# Patient Record
Sex: Male | Born: 1970 | Race: White | Hispanic: No | Marital: Single | Smoking: Former smoker
Health system: Southern US, Community
[De-identification: ages and names within clinical notes are randomized; demographics above are authoritative.]

## PROBLEM LIST (undated history)

## (undated) DIAGNOSIS — I471 Supraventricular tachycardia, unspecified: Secondary | ICD-10-CM

## (undated) DIAGNOSIS — Z5189 Encounter for other specified aftercare: Secondary | ICD-10-CM

## (undated) DIAGNOSIS — E785 Hyperlipidemia, unspecified: Secondary | ICD-10-CM

## (undated) DIAGNOSIS — F419 Anxiety disorder, unspecified: Secondary | ICD-10-CM

## (undated) DIAGNOSIS — IMO0001 Reserved for inherently not codable concepts without codable children: Secondary | ICD-10-CM

## (undated) DIAGNOSIS — D649 Anemia, unspecified: Secondary | ICD-10-CM

## (undated) DIAGNOSIS — Z860100 Personal history of colon polyps, unspecified: Secondary | ICD-10-CM

## (undated) DIAGNOSIS — K219 Gastro-esophageal reflux disease without esophagitis: Secondary | ICD-10-CM

## (undated) DIAGNOSIS — K279 Peptic ulcer, site unspecified, unspecified as acute or chronic, without hemorrhage or perforation: Secondary | ICD-10-CM

## (undated) HISTORY — DX: Anxiety disorder, unspecified: F41.9

## (undated) HISTORY — DX: Supraventricular tachycardia, unspecified: I47.10

## (undated) HISTORY — DX: Supraventricular tachycardia: I47.1

## (undated) HISTORY — DX: Hyperlipidemia, unspecified: E78.5

## (undated) HISTORY — DX: Gastro-esophageal reflux disease without esophagitis: K21.9

## (undated) HISTORY — PX: APPENDECTOMY: SHX54

## (undated) HISTORY — DX: Anemia, unspecified: D64.9

## (undated) HISTORY — DX: Personal history of colonic polyps: Z86.010

## (undated) HISTORY — DX: Peptic ulcer, site unspecified, unspecified as acute or chronic, without hemorrhage or perforation: K27.9

## (undated) HISTORY — DX: Encounter for other specified aftercare: Z51.89

## (undated) HISTORY — DX: Personal history of colon polyps, unspecified: Z86.0100

## (undated) HISTORY — DX: Reserved for inherently not codable concepts without codable children: IMO0001

## (undated) HISTORY — PX: OTHER SURGICAL HISTORY: SHX169

---

## 2000-01-19 HISTORY — PX: ROTATOR CUFF REPAIR: SHX139

## 2001-02-27 ENCOUNTER — Emergency Department (HOSPITAL_COMMUNITY): Admission: EM | Admit: 2001-02-27 | Discharge: 2001-02-27 | Payer: Self-pay | Admitting: Emergency Medicine

## 2001-02-27 ENCOUNTER — Encounter: Payer: Self-pay | Admitting: Emergency Medicine

## 2001-12-04 ENCOUNTER — Emergency Department (HOSPITAL_COMMUNITY): Admission: EM | Admit: 2001-12-04 | Discharge: 2001-12-04 | Payer: Self-pay | Admitting: Emergency Medicine

## 2001-12-04 ENCOUNTER — Encounter: Payer: Self-pay | Admitting: Emergency Medicine

## 2002-03-21 ENCOUNTER — Emergency Department (HOSPITAL_COMMUNITY): Admission: EM | Admit: 2002-03-21 | Discharge: 2002-03-21 | Payer: Self-pay | Admitting: Emergency Medicine

## 2002-03-21 ENCOUNTER — Encounter: Payer: Self-pay | Admitting: Emergency Medicine

## 2002-04-16 ENCOUNTER — Emergency Department (HOSPITAL_COMMUNITY): Admission: EM | Admit: 2002-04-16 | Discharge: 2002-04-16 | Payer: Self-pay | Admitting: *Deleted

## 2003-07-15 ENCOUNTER — Emergency Department (HOSPITAL_COMMUNITY): Admission: EM | Admit: 2003-07-15 | Discharge: 2003-07-15 | Payer: Self-pay | Admitting: Emergency Medicine

## 2007-04-26 ENCOUNTER — Emergency Department (HOSPITAL_COMMUNITY): Admission: EM | Admit: 2007-04-26 | Discharge: 2007-04-26 | Payer: Self-pay | Admitting: Emergency Medicine

## 2008-10-14 ENCOUNTER — Emergency Department (HOSPITAL_COMMUNITY): Admission: EM | Admit: 2008-10-14 | Discharge: 2008-10-14 | Payer: Self-pay | Admitting: Emergency Medicine

## 2009-12-23 ENCOUNTER — Emergency Department (HOSPITAL_COMMUNITY)
Admission: EM | Admit: 2009-12-23 | Discharge: 2009-12-23 | Payer: Self-pay | Source: Home / Self Care | Admitting: Emergency Medicine

## 2010-01-18 HISTORY — PX: HERNIA REPAIR: SHX51

## 2010-03-06 ENCOUNTER — Emergency Department (HOSPITAL_COMMUNITY): Payer: Medicaid Other

## 2010-03-06 ENCOUNTER — Inpatient Hospital Stay (HOSPITAL_COMMUNITY): Payer: Medicaid Other

## 2010-03-06 ENCOUNTER — Inpatient Hospital Stay (HOSPITAL_COMMUNITY)
Admission: EM | Admit: 2010-03-06 | Discharge: 2010-03-08 | DRG: 811 | Disposition: A | Discharge status: Home / Self Care | Payer: Medicaid Other | Attending: Nephrology | Admitting: Nephrology

## 2010-03-06 DIAGNOSIS — F411 Generalized anxiety disorder: Secondary | ICD-10-CM | POA: Diagnosis present

## 2010-03-06 DIAGNOSIS — K254 Chronic or unspecified gastric ulcer with hemorrhage: Secondary | ICD-10-CM | POA: Diagnosis present

## 2010-03-06 DIAGNOSIS — M129 Arthropathy, unspecified: Secondary | ICD-10-CM | POA: Diagnosis present

## 2010-03-06 DIAGNOSIS — K620 Anal polyp: Secondary | ICD-10-CM | POA: Diagnosis present

## 2010-03-06 DIAGNOSIS — K5730 Diverticulosis of large intestine without perforation or abscess without bleeding: Secondary | ICD-10-CM | POA: Diagnosis present

## 2010-03-06 DIAGNOSIS — I498 Other specified cardiac arrhythmias: Secondary | ICD-10-CM | POA: Diagnosis present

## 2010-03-06 DIAGNOSIS — D500 Iron deficiency anemia secondary to blood loss (chronic): Principal | ICD-10-CM | POA: Diagnosis present

## 2010-03-06 DIAGNOSIS — K621 Rectal polyp: Secondary | ICD-10-CM | POA: Diagnosis present

## 2010-03-06 DIAGNOSIS — D126 Benign neoplasm of colon, unspecified: Secondary | ICD-10-CM | POA: Diagnosis present

## 2010-03-06 DIAGNOSIS — K449 Diaphragmatic hernia without obstruction or gangrene: Secondary | ICD-10-CM | POA: Diagnosis present

## 2010-03-06 DIAGNOSIS — K219 Gastro-esophageal reflux disease without esophagitis: Secondary | ICD-10-CM | POA: Diagnosis present

## 2010-03-06 DIAGNOSIS — K644 Residual hemorrhoidal skin tags: Secondary | ICD-10-CM | POA: Diagnosis present

## 2010-03-06 DIAGNOSIS — I10 Essential (primary) hypertension: Secondary | ICD-10-CM | POA: Diagnosis present

## 2010-03-06 LAB — RAPID URINE DRUG SCREEN, HOSP PERFORMED
Cocaine: NOT DETECTED
Opiates: NOT DETECTED

## 2010-03-06 LAB — DIFFERENTIAL
Basophils Absolute: 0.1 10*3/uL (ref 0.0–0.1)
Eosinophils Absolute: 0.2 10*3/uL (ref 0.0–0.7)
Lymphs Abs: 2.1 10*3/uL (ref 0.7–4.0)
Monocytes Absolute: 0.5 10*3/uL (ref 0.1–1.0)
Monocytes Relative: 8 % (ref 3–12)
Neutro Abs: 3 10*3/uL (ref 1.7–7.7)

## 2010-03-06 LAB — CBC
HCT: 30.8 % — ABNORMAL LOW (ref 39.0–52.0)
MCHC: 27.6 g/dL — ABNORMAL LOW (ref 30.0–36.0)
Platelets: 180 10*3/uL (ref 150–400)
RDW: 17.4 % — ABNORMAL HIGH (ref 11.5–15.5)

## 2010-03-06 LAB — BRAIN NATRIURETIC PEPTIDE: Pro B Natriuretic peptide (BNP): <30 pg/mL (ref 0.0–100.0)

## 2010-03-06 LAB — TROPONIN I: Troponin I: 0.01 ng/mL (ref 0.00–0.06)

## 2010-03-06 LAB — HEPATIC FUNCTION PANEL
ALT: 18 U/L (ref 0–53)
Alkaline Phosphatase: 79 U/L (ref 39–117)
Bilirubin, Direct: <0.1 mg/dL (ref 0.0–0.3)

## 2010-03-06 LAB — BASIC METABOLIC PANEL
BUN: 9 mg/dL (ref 6–23)
GFR calc non Af Amer: >60 mL/min (ref >60)
Glucose, Bld: 71 mg/dL (ref 70–99)
Potassium: 3.6 mEq/L (ref 3.5–5.1)

## 2010-03-06 LAB — PROTIME-INR: INR: 0.98 (ref 0.00–1.49)

## 2010-03-06 MED ORDER — IOHEXOL 300 MG/ML  SOLN
100.0000 mL | Freq: Once | INTRAMUSCULAR | Status: AC | PRN
  Administered 2010-03-06: 100 mL via INTRAVENOUS

## 2010-03-07 ENCOUNTER — Other Ambulatory Visit (INDEPENDENT_AMBULATORY_CARE_PROVIDER_SITE_OTHER): Payer: Self-pay | Admitting: Internal Medicine

## 2010-03-07 DIAGNOSIS — K219 Gastro-esophageal reflux disease without esophagitis: Secondary | ICD-10-CM

## 2010-03-07 DIAGNOSIS — D126 Benign neoplasm of colon, unspecified: Secondary | ICD-10-CM

## 2010-03-07 DIAGNOSIS — K253 Acute gastric ulcer without hemorrhage or perforation: Secondary | ICD-10-CM

## 2010-03-07 DIAGNOSIS — D509 Iron deficiency anemia, unspecified: Secondary | ICD-10-CM

## 2010-03-07 DIAGNOSIS — K209 Esophagitis, unspecified: Secondary | ICD-10-CM

## 2010-03-07 DIAGNOSIS — K921 Melena: Secondary | ICD-10-CM

## 2010-03-07 LAB — FERRITIN: Ferritin: 2 ng/mL — ABNORMAL LOW (ref 22–322)

## 2010-03-07 LAB — LIPID PANEL
HDL: 22 mg/dL — ABNORMAL LOW (ref >39)
LDL Cholesterol: 54 mg/dL (ref 0–99)
Total CHOL/HDL Ratio: 5.5 RATIO
Triglycerides: 227 mg/dL — ABNORMAL HIGH (ref <150)
VLDL: 45 mg/dL — ABNORMAL HIGH (ref 0–40)

## 2010-03-07 LAB — COMPREHENSIVE METABOLIC PANEL
ALT: 17 U/L (ref 0–53)
AST: 15 U/L (ref 0–37)
Calcium: 9.1 mg/dL (ref 8.4–10.5)
GFR calc Af Amer: >60 mL/min (ref >60)
Sodium: 141 mEq/L (ref 135–145)
Total Protein: 6.7 g/dL (ref 6.0–8.3)

## 2010-03-07 LAB — CBC
Hemoglobin: 7.9 g/dL — ABNORMAL LOW (ref 13.0–17.0)
MCH: 17.8 pg — ABNORMAL LOW (ref 26.0–34.0)
RBC: 4.43 MIL/uL (ref 4.22–5.81)
WBC: 5.7 10*3/uL (ref 4.0–10.5)

## 2010-03-07 LAB — DIFFERENTIAL
Basophils Absolute: 0.1 10*3/uL (ref 0.0–0.1)
Basophils Relative: 2 % — ABNORMAL HIGH (ref 0–1)
Lymphocytes Relative: 38 % (ref 12–46)
Monocytes Relative: 9 % (ref 3–12)
Neutro Abs: 2.7 10*3/uL (ref 1.7–7.7)

## 2010-03-07 LAB — CARDIAC PANEL(CRET KIN+CKTOT+MB+TROPI)
Relative Index: INVALID (ref 0.0–2.5)
Total CK: 55 U/L (ref 7–232)

## 2010-03-07 LAB — MAGNESIUM: Magnesium: 2.1 mg/dL (ref 1.5–2.5)

## 2010-03-07 LAB — IRON AND TIBC: Saturation Ratios: 4 % — ABNORMAL LOW (ref 20–55)

## 2010-03-08 LAB — CROSSMATCH
ABO/RH(D): A POS
Antibody Screen: NEGATIVE
Unit division: 0

## 2010-03-08 LAB — TSH: TSH: 3.891 u[IU]/mL (ref 0.350–4.500)

## 2010-03-08 LAB — DIFFERENTIAL
Basophils Absolute: 0.1 10*3/uL (ref 0.0–0.1)
Basophils Relative: 1 % (ref 0–1)
Eosinophils Absolute: 0.2 10*3/uL (ref 0.0–0.7)
Eosinophils Relative: 4 % (ref 0–5)
Lymphocytes Relative: 30 % (ref 12–46)
Lymphs Abs: 1.7 10*3/uL (ref 0.7–4.0)
Monocytes Absolute: 0.4 10*3/uL (ref 0.1–1.0)
Monocytes Relative: 7 % (ref 3–12)
Neutro Abs: 3.2 10*3/uL (ref 1.7–7.7)
Neutrophils Relative %: 58 % (ref 43–77)

## 2010-03-08 LAB — CBC
HCT: 29.4 % — ABNORMAL LOW (ref 39.0–52.0)
Hemoglobin: 8.2 g/dL — ABNORMAL LOW (ref 13.0–17.0)
MCH: 18.6 pg — ABNORMAL LOW (ref 26.0–34.0)
MCHC: 27.9 g/dL — ABNORMAL LOW (ref 30.0–36.0)
MCV: 66.7 fL — ABNORMAL LOW (ref 78.0–100.0)
Platelets: 144 10*3/uL — ABNORMAL LOW (ref 150–400)
RBC: 4.41 MIL/uL (ref 4.22–5.81)
RDW: 18 % — ABNORMAL HIGH (ref 11.5–15.5)
WBC: 5.6 10*3/uL (ref 4.0–10.5)

## 2010-03-09 LAB — H. PYLORI ANTIBODY, IGG: H Pylori IgG: 2.76 {ISR} — ABNORMAL HIGH

## 2010-03-14 NOTE — Discharge Summary (Signed)
  NAMEBIFF, [name]               ACCOUNT NO.:  [id number]  MEDICAL RECORD NO.:  [id number]           PATIENT TYPE:  LOCATION:                                 FACILITY:  PHYSICIAN:  Celso Amy, MD   DATE OF BIRTH:  03/14/1970  DATE OF ADMISSION: DATE OF DISCHARGE:  LH                              DISCHARGE SUMMARY   ADMITTING DIAGNOSES: 1. Supraventricular tachycardia. 2. Microcytic anemia. 3. Rectal bleeding. 4. Gastroesophageal reflux disease. 5. Arthritis. 6. Anxiety disorder.  DISCHARGE DIAGNOSES: 1. Resolved supraventricular tachycardia. 2. Rectal bleeding, now resolved. 3. Microcytic anemia because of chronic blood loss. 4. Gastroesophageal reflux disease. 5. Arthritis. 6. Anxiety disorder.  CONSULTS OBTAINED:  GI consult was obtained.  PROCEDURE PERFORMED:  EGD and colonoscopy was done.  MEDICATIONS AT TIME OF ADMISSION: 1. Omeprazole 20 mg p.o. daily. 2. Vicodin 5/500 p.r.n. 3. Xanax p.r.n.  DISCHARGE MEDICATIONS: 1. Iron sulfate 325 mg p.o. b.i.d. 2. Metoprolol 12.5 mg p.o. daily. 3. Omeprazole 20 mg p.o. daily. 4. Vicodin 5/500 p.r.n. as needed. 5. Xanax as needed. 6. NO-NSAID. 7. Anusol-HC suppository 1 per rectal for next 14 days.  HOSPITAL COURSE AND TREATMENT: 1. GI:  The patient was admitted with bright red per rectum.  The     patient's GI consult was obtained and the patient was given 1 unit     of PRBC.  The patient's hemoglobin is stable next day more than 8.     The patient is asymptomatic. During GI procedure- Biopsy was taken to rule out Barrett's.     The  patient also had 2 superficial gastric ulcer at the antrum and 3     cecal polyps biopsy were taken.  The patient did not bleed while in     the hospital and the patient's hemoglobin remained stable at the     time of discharge. 2. Anemia.  The patient's anemia workup shows that this was because of     iron deficiency.  The patient's ferritin was 2, iron saturation was     4%.   The patient was given 1 unit of PRBC and was started on p.o.     iron repletion. 3. Hypertension.  The patient was continued on metoprolol during the     hospital stay. 4. DVT.  The patient was kept on DVT prophylaxis during the hospital     stay.  FOLLOWUP: 1. The patient is to follow up with primary care in 1-2 weeks. 2. The patient is to follow up with GI. 3. The patient is to have high fiber diet as outpatient.     Celso Amy, MD     MB/MEDQ  D:  03/08/2010  T:  03/08/2010  Job:  [id number]  Electronically Signed by Celso Amy M.D. on 03/14/2010 02:17:08 PM

## 2010-03-23 ENCOUNTER — Other Ambulatory Visit: Payer: Self-pay | Admitting: Cardiology

## 2010-03-23 ENCOUNTER — Encounter: Payer: Self-pay | Admitting: Adult Health

## 2010-03-23 ENCOUNTER — Encounter (INDEPENDENT_AMBULATORY_CARE_PROVIDER_SITE_OTHER): Payer: Self-pay | Admitting: *Deleted

## 2010-03-23 ENCOUNTER — Encounter (INDEPENDENT_AMBULATORY_CARE_PROVIDER_SITE_OTHER): Payer: Medicaid Other | Admitting: Adult Health

## 2010-03-23 DIAGNOSIS — R002 Palpitations: Secondary | ICD-10-CM

## 2010-03-23 DIAGNOSIS — R079 Chest pain, unspecified: Secondary | ICD-10-CM

## 2010-03-23 DIAGNOSIS — I10 Essential (primary) hypertension: Secondary | ICD-10-CM | POA: Insufficient documentation

## 2010-03-23 DIAGNOSIS — F1021 Alcohol dependence, in remission: Secondary | ICD-10-CM | POA: Insufficient documentation

## 2010-03-24 ENCOUNTER — Encounter (INDEPENDENT_AMBULATORY_CARE_PROVIDER_SITE_OTHER): Payer: Medicaid Other

## 2010-03-24 ENCOUNTER — Ambulatory Visit (HOSPITAL_COMMUNITY)
Admission: RE | Admit: 2010-03-24 | Discharge: 2010-03-24 | Disposition: A | Discharge status: Home / Self Care | Payer: Medicaid Other | Source: Ambulatory Visit | Attending: Cardiology | Admitting: Cardiology

## 2010-03-24 ENCOUNTER — Ambulatory Visit (HOSPITAL_COMMUNITY): Payer: Medicaid Other

## 2010-03-24 ENCOUNTER — Encounter (HOSPITAL_COMMUNITY): Payer: Self-pay

## 2010-03-24 ENCOUNTER — Encounter: Payer: Self-pay | Admitting: Adult Health

## 2010-03-24 DIAGNOSIS — R072 Precordial pain: Secondary | ICD-10-CM

## 2010-03-24 DIAGNOSIS — I471 Supraventricular tachycardia, unspecified: Secondary | ICD-10-CM

## 2010-03-24 DIAGNOSIS — R079 Chest pain, unspecified: Secondary | ICD-10-CM

## 2010-03-24 DIAGNOSIS — I517 Cardiomegaly: Secondary | ICD-10-CM

## 2010-03-24 DIAGNOSIS — Z8249 Family history of ischemic heart disease and other diseases of the circulatory system: Secondary | ICD-10-CM | POA: Insufficient documentation

## 2010-03-24 MED ORDER — TECHNETIUM TC 99M TETROFOSMIN IV KIT
10.0000 | PACK | Freq: Once | INTRAVENOUS | Status: AC | PRN
Start: 2010-03-24 — End: 2010-03-24
  Administered 2010-03-24: 9.7 via INTRAVENOUS

## 2010-03-24 MED ORDER — TECHNETIUM TC 99M TETROFOSMIN IV KIT
30.0000 | PACK | Freq: Once | INTRAVENOUS | Status: AC | PRN
  Administered 2010-03-24: 31.2 via INTRAVENOUS

## 2010-03-25 ENCOUNTER — Encounter: Payer: Self-pay | Admitting: *Deleted

## 2010-03-25 ENCOUNTER — Telehealth (INDEPENDENT_AMBULATORY_CARE_PROVIDER_SITE_OTHER): Payer: Self-pay

## 2010-03-26 ENCOUNTER — Encounter: Payer: Self-pay | Admitting: Cardiology

## 2010-03-29 NOTE — Consult Note (Signed)
[name], [name]               ACCOUNT NO.:  [id number]  MEDICAL RECORD NO.:  [id number]           PATIENT TYPE:  I  LOCATION:  A201                          FACILITY:  [facility]  PHYSICIAN:  [name], M.D.    DATE OF BIRTH:  1970-03-11  DATE OF CONSULTATION:  03/07/2010 DATE OF DISCHARGE:                                CONSULTATION   REASON FOR CONSULTATION:  Chronic GERD, chronic hematochezia, and microcytic anemia suspected to be iron deficiency anemia.  HISTORY OF PRESENT ILLNESS:  [name] is a 40 year old Caucasian male who was admitted to Hospitalist Service yesterday afternoon via emergency room where he presented with a 4-5 day history of palpitation.  In the emergency room, he was noted with SVT but this resolved spontaneously. In the emergency room, he was also noted to be anemic with hemoglobin of 8.5, hematocrit of 30.8, MCV of 60.4.  The patient's troponin level x4 have been normal.  He also had a BNP of less than 30.  The patient's hemoglobin has dropped to 7.9 this morning.  The patient states he would note palpitation or fluttering in his chest with activity but he does not experience any chest pain or shortness of breath.  He would get better with rest but yesterday it continued, the reason he came to emergency room.  He does complain of chronic hematochezia.  He states this has been going on for 14 years.  About 10 years ago, he had colonoscopy at [facility] in [place] and was told it was normal. Lately he has been passing blood with almost every bowel movements. Most of the time it is a scant amount of fresh blood but every so often he passes large amount of blood.  He had one episode 5 days ago and states blood was dripping down his legs.  He also complains of chronic heartburn.  He states as long as he takes his PPI, it is well controlled.  He has been on therapy for at least 10 years.  He denies diarrhea or constipation.  Occasionally he may experience mid  abdominal pain but this is treating.  He has a very good appetite and he has not lost any weight involuntarily.  He also denies nausea, vomiting, hematemesis, or dysphagia.  He says he was seen at Dr. Silvana Cordova office Ms. [name] about 3 months ago and does not remember if he had any lab studies done.  He also indicates that he has developed left inguinal hernia which is gradually getting large but he has not had any pain pertaining to it.  At home he has been on omeprazole 20 mg daily, Vicodin 5/500 daily p.r.n. which he does not take very often and Xanax question dose daily p.r.n.  PAST MEDICAL HISTORY:  He had appendectomy in 1994.  He had repair for right rotator cuff tear in 2002.  He had surgery on his left tibia for a fracture and had a rod placed in 1995.  He has been told that his glucose level has been borderline but he has never been told he is diabetic.  He has had symptoms of GERD for  more than 10 years.  He has history of anxiety and he has joint pain involving ankles and his feet.  ALLERGIES:  NK.  FAMILY HISTORY:  Father had colonic polyps.  He also had alcoholic liver disease but died of MI at age 52.  Mother had COPD and CHF and died at 67.  He has 1 brother, 1 sister in good health.  He has first cousin on his dad's side who was treated for colon carcinoma around age 37 and is doing fine.  SOCIAL HISTORY:  He has been married and divorced twice.  Presently he is living with his girlfriend and they have a 81-year-old daughter.  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 He is going to [employer] and almost done with his GED.  He smoked cigarettes off and on for number of years but quit 8 months ago.  He used to drink alcohol daily and lately he was drinking 12 cans a day but decided to quit and has not had any in the last 8 months.  OBJECTIVE:  VITAL SIGNS:  Weight reportedly 285 pounds.  He is 76.5 inches tall, pulse 72 per minute  and regular, blood pressure 120/74, temperature is 97.7, respirations 18. HEENT:  Conjunctivae is pale.  Sclera is nonicteric.  Oropharyngeal mucosa is normal.  Dentition in satisfactory condition. NECK:  No thyromegaly or lymphadenopathy noted. CARDIAC:  Regular rhythm.  Normal S1 and S2.  No murmur or gallop noted. LUNGS:  Clear to auscultation. ABDOMEN:  Symmetrical.  Bowel sounds are normal on palpation, soft and nontender.  He has left inguinal hernia which was completely reducible. RECTAL:  Deferred.  He had one in emergency room revealing heme-positive stool. EXTREMITIES:  His palms are very pale.  He does not have clubbing or koilonychia.  He does not have peripheral edema.  LABORATORY DATA FROM ADMISSION:  WBC 5.9, H and H 8.5 and 30.8, platelet count 180K, MCV of 65.4.  Sodium 140, potassium 3.6, chloride 107, CO2 25, glucose 71, BUN 9, creatinine 0.89, calcium 9.5, INR 0.98.  He also had urine drug screen which was negative.  BNP was less than 30 pg/mL. His cardiac enzymes x3 have been negative.  Lab data from this morning, H and H is 7.9 and 28.7, magnesium 2.1.  His bilirubin is 0.9, AP 66, SGOT 15, SGPT 17, total protein 6.7 with albumin of 3.8.  Chest film from admission, cardiac silhouette upper limit of normal.  No other abnormality noted.  He also had abdominopelvic CT with contrast which revealed left inguinal hernia containing fat and splenomegaly.  His liver measured 15 cm.  No abnormality noted to liver or in small and large intestine.  His anemia profile is pending.  ASSESSMENT:  [name] is 40 year old Caucasian male who presents with supraventricular tachycardia which resolved spontaneously while he was in emergency room.  His cardiac enzymes are negative.  On presentation, he was noted to have microcytic anemia, very interesting history that he has been passing blood with bowel movements for 14 years lately, increasing amounts.  He also is in chronic PPI  therapy for GERD and may also have an element of iron malabsorption.  He has been told that he is possibly bleeding from hemorrhoids.  His last colonoscopy 10 years ago elsewhere was normal.  His upper GI tract needs to be evaluated to make sure he does not have Barrett's esophagus or an occult process without any symptoms.  Lower GI tract needs to  be evaluated to make sure he does not have polyp, neoplasm, AV malformation.  If indeed he has large hemorrhoids, he need to have these banded or ligated.  RECOMMENDATIONS:  A diagnostic esophagogastroduodenoscopy and colonoscopy to be performed later today.  I have reviewed both the procedure and risks with the patient, he is agreeable.  He will be prepped with GoLYTELY this morning and undergo the study around 1 o'clock today.  We will also check his TSH.  As far as his anemia is concerned, we will discuss with Dr. Wolfgang Phoenix retransfusion or iron infusion.  We appreciate the opportunity to participate in the care of this gentleman.     [name], M.D.     NR/MEDQ  D:  03/07/2010  T:  03/07/2010  Job:  [id number]  cc:   [name]  Electronically Signed by [name] M.D. on 03/29/2010 01:15:17 PM

## 2010-03-29 NOTE — Op Note (Signed)
NAMEJANMICHAEL, [name]               ACCOUNT NO.:  [id number]  MEDICAL RECORD NO.:  [id number]           PATIENT TYPE:  I  LOCATION:  A301                          FACILITY:  [facility]  PHYSICIAN:  Lionel December, M.D.    DATE OF BIRTH:  12-06-1970  DATE OF PROCEDURE:  03/07/2010 DATE OF DISCHARGE:                              OPERATIVE REPORT   PROCEDURE:  Esophagogastroduodenoscopy followed by colonoscopy.  INDICATION:  [name] is a 40 year old Caucasian male who was admitted yesterday with microcytic anemia and chronic hematochezia.  He actually presented to the emergency room with palpation and noted to have SVT but corrected spontaneously.  His hemoglobin this morning is 7.9.  His MCV is around 63 and his iron studies are pending.  He has been bleeding for several years.  He also has a chronic GERD, has been on a PPI for 10 years, but his upper GI tract has never been examined.  He is undergoing diagnostic EGD and colonoscopy.  Procedure and risks were reviewed with the patient, and informed consent was obtained.  MEDICATIONS FOR CONSCIOUS SEDATION:  Cetacaine spray for pharyngeal topical anesthesia, Demerol 50 mg IV, and Versed 8 mg IV.  FINDINGS:  Procedure performed in endoscopy suite.  The patient's vital signs and O2 saturations were monitored during the procedure and remained stable.  1. Esophagogastroduodenoscopy.  The patient was placed in left lateral     recumbent position and Pentax videoscope was passed through     oropharynx without any difficulty into esophagus.  Esophagus:  Mucosa of the esophagus was normal.  GE junction was located at 42 cm from the incisors and revealed serrated AV junction but there was a single tongue of pink mucosa about 10-12 mm long.  This area was biopsied on the way out to rule out short segment Barrett's.  Hiatus was at 45.  Mucosa of herniated part of stomach was normal.  Stomach:  It was empty and distended very well with insufflation.   Folds of proximal stomach are normal.  Mucosa at body was normal.  In the antrum distally, there were 2 superficial ulcers, one of these was about 10-12 mm in maximal diameter and other one was small.  They both had clean base.  Pyloric channel was patent.  Angularis, fundus, and cardia were examined by retroflexing scope and were normal.  Duodenum:  Bulbar mucosa was normal.  Scope was passed into second part of duodenum where mucosa and folds were normal.  Endoscope was withdrawn.  On the way out biopsy was taken from GE junction as described above. The patient was prepared for procedure #2.  1. Colonoscopy.  Rectal examination performed.  No abnormality noted     on external or digital exam.  Pentax videoscope was placed in     rectum and advanced under vision into sigmoid colon and beyond.     Two small diverticula were noted at sigmoid colon.  Preparation was     excellent.  Very redundant colon.  By repeating withdrawing the     scope and using abdominal pressure, I was able to advance the scope  into cecum which was identified by appendiceal orifice and     ileocecal valve.  Pictures were taken for the record.  There was     about 12 to 15-mm submucosal lipoma across from ileocecal valve     which was left alone.  There was a 3-mm polyp to the left of     appendiceal orifice which was ablated via cold biopsy.  As the     scope was withdrawn, colonic mucosa was carefully examined.  There     was 6 to 7-mm polyp at rectosigmoid junction.  I started to remove     it via cold biopsy, it was too large and therefore, rest of the     polyp was snared.  Polypectomy was completed.  In the rectum, there     were 6 other small flat polyps with appearance suggestive of     adenomas.  These were coagulated using snare tip.  Scope was     retroflexed to examine anorectal junction and he had hemorrhoids     below the dentate line and one had red streaks over it, but no     active  bleeding was noted.  Endoscope was withdrawn.  Withdrawal     time was over 12 minutes.  The patient tolerated the procedures well.  FINAL DIAGNOSES: 1. 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 2. A 3-cm size sliding hiatal hernia. 3. Two superficial gastric ulcers at antrum without stigmata of bleed.     These lesions could be a source of occult gastrointestinal blood     loss. 4. Normal bulbar and postbulbar mucosa. 5. External hemorrhoids felt to be source of patient's hematochezia. 6. Two tiny diverticula at sigmoid colon. 7. Small polyp ablated via cold biopsy from sigmoid colon. 8. A 6 to 7-mm sessile polyp at rectosigmoid junction which was     biopsied and then residual polyp was snared. 9. Six small polyps at rectum were coagulated using snare tip.  I suspect his iron-deficiency anemia as multifactorial, i.e. chronic hematochezia but he could also be losing blood from his upper GI tract and may even have impaired iron absorption since he is on chronic PPI therapy.  RECOMMENDATIONS: 1. The patient advised not to take any NSAIDs. 2. High-fiber diet. 3. Anusol-HC suppository one per rectum at bedtime for 2 weeks.  If     hematochezia persist, he may need hemorrhoid ligation, etc. 4. We will check his H. pylori serology. 5. When he goes, he will also need oral iron therapy.  I will be contacting the patient with the biopsy results and further recommendations.     Lionel December, M.D.    NR/MEDQ  D:  03/07/2010  T:  03/07/2010  Job:  [id number]  cc:   Jonathan Feeler, PA-C  Electronically Signed by Lionel December M.D. on 03/29/2010 01:15:49 PM

## 2010-03-31 NOTE — Letter (Signed)
Summary: [place] [facility] (Nuc Med Stress)  [facility]  [address], [place] [address]   Phone: [phone]  Fax: [phone]    Nuclear Medicine 1-Day Stress Test Information Sheet  Re:     DUAINE RADIN   DOB:     1970/09/30 MRN:     [id number] Weight:  Appointment Date: Register at: Appointment Time: Referring MD:  _x__Exercise Stress  __Adenosine   __Dobutamine  __Lexiscan  __Persantine   __Thallium  Urgency: ____1 (next day)   ____2 (one week)    ____3 (PRN)  Patient will receive Follow Up call with results: Patient needs follow-up appointment:  Instructions regarding medication:  How to prepare for your stress test: 1. DO NOT eat or dring 8 hours prior to your arrival time. This includes no caffeine (coffee, tea, sodas, chocolate) if you were instructed to take your medications, drink water with it. 2. DO NOT use any tobacco products for at leaset 8 hours prior to arrival. 3. DO NOT wear dresses or any clothing that may have metal clasps or buttons. 4. Wear short sleeve shirts, loose clothing, and comfortalbe walking shoes. 5. DO NOT use lotions, oils or powder on your chest before the test. 6. The test will take approximately 3-4 hours from the time you arrive until completion. 7. To register the day of the test, go to the Short Stay entrance at [facility]. 8. If you must cancel your test, call [phone] as soon as you are aware. 9. DO NOT TAKE YOUR AM MEDICATION THE MORNING OF YOUR STRESS TEST  After you arrive for test:   When you arrive at [facility], you will go to Short Stay to be registered. They will then send you to Radiology to check in. The Nuclear Medicine Tech will get you and start an IV in your arm or hand. A small amount of a radioactive tracer will then be injected into your IV. This tracer will then have to circulate for 30-45 minutes. During this time you will wait in the waiting room and you will be able to  drink something without caffeine. A series of pictures will be taken of your heart follwoing this waiting period. After the 1st set of pictures you will go to the stress lab to get ready for your stress test. During the stress test, another small amount of a radioactive tracer will be injected through your IV. When the stress test is complete, there is a short rest period while your heart rate and blood pressure will be monitored. When this monitoring period is complete you will have another set of pictrues taken. (The same as the 1st set of pictures). These pictures are taken between 15 minutes and 1 hour after the stress test. The time depends on the type of stress test you had. Your doctor will inform you of your test results within 7 days after test.    The possibilities of certain changes are possible during the test. They include abnormal blood pressure and disorders of the heart. Side effects of persantine or adenosine can include flushing, chest pain, shortness of breath, stomach tightness, headache and light-headedness. These side effects usually do not last long and are self-resolving. Every effort will be made to keep you comfortable and to minimize complications by obtaining a medical history and by close observation during the test. Emergency equipment, medications, and trained personnel are available to deal with any unusual situation which may arise.  Please notify  office at least 48 hours in advance if you are unable to keep this appt.

## 2010-03-31 NOTE — Procedures (Signed)
Summary: Holter and Event  Holter and Event   Imported By: Faythe Ghee 03/26/2010 13:09:10  _____________________________________________________________________  External Attachment:    Type:   Image     Comment:   External Document

## 2010-03-31 NOTE — Assessment & Plan Note (Signed)
Summary: eph svt per matt pa [place]/tmj   Visit Type:  Follow-up Primary Provider:  DR.Prudy Feeler   CC:  post hospital.  History of Present Illness: [name] is a 40 y/o CM we are asked to see at the kind request of Dr. Prudy Feeler. [name] was recently admitted to [facility] with palpatations. He has a history of GERD,anxiety,anemia, and rectal bleeding. He rarely sees a physician unless he is having an illness.  During hospitalization he had a colonoscopy and EGD which demonstarted 2 superficial gastric ulcers at the antrum, and colon polyps.  He was given a unit of blood.  He was also found (per H&P documentation and discharge summary only) that he had episodes of SVT during his admission that spontaneously converted on its own.  He was discharged on metoprolol 12.5mg  daily.   He continues to have complaints of palpatations occuring everyday (10-15 times) per patient.  The metoprolol has helped but not eliminated the palpatations. He also has some left sided soreness under his arm, but it is there all the time. He denies excessive caffine use or drug use.  He did smoke and drink heavily up until June of 2011.  During hospitalization he did not have cardiac testing or a cardiac consultation.  We are asked to see him now for evaluation and recommendations.  Labs during hospitalization: TC 121, TG 227; LDL 54; HDL 22.  TSH 3.89.  Hgb 8.2;Hct 29.4.  K+ 3.9. LFT's WNL.  Preventive Screening-Counseling & Management  Alcohol-Tobacco     Alcohol drinks/day: 0     Smoking Status: quit > 6 months  Current Medications (verified): 1)  Prilosec 20 Mg Cpdr (Omeprazole) .... Take 1 Tab Daily 2)  Vicodin 5-500 Mg Tabs (Hydrocodone-Acetaminophen) .... Take As Needed 3)  Alprazolam 0.25 Mg Tabs (Alprazolam) .... As Needed 4)  Ferrous Sulfate 325 (65 Fe) Mg Tabs (Ferrous Sulfate) .... Take 1 Tab Two Times A Day 5)  Metoprolol Tartrate 25 Mg Tabs (Metoprolol Tartrate) .... Take 1 Tablet By Mouth  Once A Day 6)  Alprazolam 1 Mg Tabs (Alprazolam) .... As Needed  Allergies (verified): No Known Drug Allergies  Comments:  Nurse/Medical Assistant: patient and i reviewed meds from previous discharge summary the drug store in [place]  Past History:  Past medical, surgical, family and social histories (including risk factors) reviewed, and no changes noted (except as noted below).  Past Medical History: Reviewed history from 03/20/2010 and no changes required. superventricular tachycardia microcytic anemia rectal bleeding gerds arthritis anxiety disorder  Past Surgical History: Reviewed history from 03/20/2010 and no changes required. egd and colonoscopy steel rod left leg appendectomy  Family History: Reviewed history from 03/20/2010 and no changes required. Father:deceased  Mother:deceased Siblings:1 sister 1 brother   Social History: Reviewed history from 03/20/2010 and no changes required. Tobacco Use - Former.  Alcohol Use - no Regular Exercise - no Drug Use - no Pt smoked heavily until June of 2011. Drank 1 1/2 cases of beer a week (5-days) until June of 2011.Alcohol drinks/day:  0 Smoking Status:  quit > 6 months  Review of Systems       All other systems have been reviewed and are negative unless stated above.   Vital Signs:  Patient profile:   40 year old male Height:      76 inches Weight:      281 pounds BMI:     34.33 Pulse rate:   77 / minute BP sitting:   140 /  82  (left arm)  Vitals Entered By: Dreama Saa, CNA (March 23, 2010 12:59 PM)  Physical Exam  General:  Well developed, well nourished, in no acute distress. Head:  normocephalic and atraumatic Eyes:  PERRLA/EOM intact; conjunctiva and lids normal. Lungs:  Clear bilaterally to auscultation and percussion. Heart:  Non-displaced PMI, chest non-tender; regular rate and rhythm, occasional extra systole. S1, S2 without murmurs, rubs or gallops. Carotid upstroke normal, no bruit.  Normal abdominal aortic size, no bruits. Femorals normal pulses, no bruits. Pedals normal pulses. No edema, no varicosities. Abdomen:  Bowel sounds positive; abdomen soft and non-tender without masses, organomegaly, or hernias noted. No hepatosplenomegaly. Msk:  Back normal, normal gait. Muscle strength and tone normal. Pulses:  pulses normal in all 4 extremities Extremities:  No clubbing or cyanosis. Neurologic:  Alert and oriented x 3. Psych:  Normal affect.   EKG  Procedure date:  03/23/2010  Findings:      Normal sinus rhythm with rate of:69 bpm  PVC's noted.    Impression & Recommendations:  Problem # 1:  PALPITATIONS (ICD-785.1) Mr. Bisono continues to complain of freqent palpations which are worrisome to him. Review of hospital records does not have documentation of rhythm strips to confirm his SVT.  Metoprolol has helped but no completely resolved them. We will have a Holter monitor placed to evaluate morphology, duration and frequency of palpatations. Review of labs does not indicate low potassium or significant anemia at this time.  We will increase metoprolol to 25mg  daily.  Echocardiogram will be completed for LV fx and to assess him for cardiomyopathy with his hx of ETOH abuse.  Although he has abstained he has had many years of abuse prior.   Stress myoview will be ordered as well secondary to family history of CAD in relatives and "boarderline" diabetes for assessment of ischemia.  I have discussed this with Dr. Diona Browner who has reviewed the patients hospital chart and office evaluation. He agrees with the plan.  He will see him after tests are completed for further recommendations.  His updated medication list for this problem includes:    Metoprolol Tartrate 25 Mg Tabs (Metoprolol tartrate) .Marland Kitchen... Take 1 tablet by mouth once a day  Orders: 2-D Echocardiogram (2D Echo) Nuclear Stress Test (Nuc Stress Test) Holter Monitor (Holter Monitor)  Problem # 2:  ALCOHOL ABUSE,  HX OF (ICD-V11.3) He states he has not had a drink in 9 months. Will evaluate for CM per echo.  Problem # 3:  HYPERTENSION (ICD-401.9) Moderately controlled at this time.  With increase of metoprolol, will monitor response. His updated medication list for this problem includes:    Metoprolol Tartrate 25 Mg Tabs (Metoprolol tartrate) .Marland Kitchen... Take 1 tablet by mouth once a day  Patient Instructions: 1)  Your physician has requested that you have an echocardiogram.  Echocardiography is a painless test that uses sound waves to create images of your heart. It provides your doctor with information about the size and shape of your heart and how well your heart's chambers and valves are working.  This procedure takes approximately one hour. There are no restrictions for this procedure. 2)  Your physician has recommended that you wear a holter monitor.  Holter monitors are medical devices that record the heart's electrical activity. Doctors most often use these monitors to diagnose arrhythmias. Arrhythmias are problems with the speed or rhythm of the heartbeat. The monitor is a small, portable device. You can wear one while you do your normal  daily activities. This is usually used to diagnose what is causing palpitations/syncope (passing out). 3)  Your physician has requested that you have an exercise stress myoview.  For further information please visit [email or web address]  Please follow instruction sheet, as given. 4)  Your physician recommends that you schedule a follow-up appointment in: after tests with MD Prescriptions: METOPROLOL TARTRATE 25 MG TABS (METOPROLOL TARTRATE) Take 1 tablet by mouth once a day  #30 x 3   Entered by:   Teressa Lower RN   Authorized by:   Joni Reining, NP   Signed by:   Teressa Lower RN on 03/23/2010   Method used:   Electronically to        [facility] (retail)       [address]       [place], [place]  [address]        Ph: [phone]       Fax: [phone]   RxID:   [id number]

## 2010-03-31 NOTE — Progress Notes (Signed)
**Note De-Identified Lenaya Pietsch Obfuscation** Summary: Echo and stress test results  Phone Note Outgoing Call   Call placed by: Tammy Sanders Call placed to: Patient Summary of Call: left message for pt to call for results of echo and stress test Initial call taken by: Teressa Lower RN,  March 25, 2010 8:35 AM  Follow-up for Phone Call        Pt. advised of Echo and Stress test. Per pt. request, additional information explaining ejection fraction from internet mailed to pt's home address. Follow-up by: Larita Fife Taesha Goodell LPN,  March 25, 2010 5:52 PM

## 2010-04-05 NOTE — H&P (Signed)
NAMECHRISTAPHER, [name]               ACCOUNT NO.:  [id number]  MEDICAL RECORD NO.:  [id number]           PATIENT TYPE:  E  LOCATION:  [facility]                          FACILITY:  [facility]  PHYSICIAN:  Osvaldo Shipper, MD     DATE OF BIRTH:  05-Aug-1970  DATE OF ADMISSION:  03/06/2010 DATE OF DISCHARGE:  LH                             HISTORY & PHYSICAL   PRIMARY CARE PHYSICIAN:  Prudy Feeler, who is a Advice worker in [place].  ADMISSION DIAGNOSES: 1. Supraventricular tachycardia, resolved. 2. Microcytic anemia. 3. Rectal bleeding. 4. History of acid reflux disease. 5. History of arthritis and anxiety disorder.  CHIEF COMPLAINT:  Palpitations.  HISTORY OF PRESENT ILLNESS:  The patient is a 40 year old Caucasian male who has a history of acid reflux disease, arthritis, and anxiety disorder who was in his usual state of health until Monday when he started noticing that he experienced some fluttering in his chest. Whenever it would happen, he would feel like he was going to lose his breath, but he did not get short of breath.  These symptoms started getting worse and so he decided to come in to the hospital for evaluation.  He denies any nausea or vomiting, no cough or fever, no diaphoresis, no dizziness.  He denies any chest pain per se.  He was having some pain under the left arm earlier, but none currently.  He had similar symptoms about a year ago, but did not seek attention.  He does not drink coffee, but does have tea 2-3 times a day.  He does drink about 2-3 cokes every day.  Upon questioning him regarding his anemia, he mentioned that he has rectal bleeding ongoing for many many years.  He had a colonoscopy 11 years ago done by Dr. Gabriel Cirri which was unremarkable.  He denies any black stools.  Denies any dysphagia.  Denies any bleeding in his emesis. The rectal bleeding can vary in quantity and sometimes it occurs even without bowel movement and is usually painless.   However, he does have some upper abdominal pain that he gets on and off with no relation to food or meals.  MEDICATIONS AT HOME: 1. Omeprazole 20 mg daily. 2. Vicodin 5/500 as needed. 3. Xanax blue pill as needed.  He has not taken his Vicodin and Xanax in 1 week.  He took Mucinex 7 days ago.  Denies any other over-the-counter medication.  Denies any alternative medications.  ALLERGIES:  No known drug allergies.  PAST MEDICAL HISTORY:  Positive for appendectomy and a steel rod in his left leg.  Past medical history also includes acid reflux disease, arthritis, and anxiety disorder.  SOCIAL HISTORY:  Lives in [place] with his girlfriend.  Currently, unemployed.  He is trying to get his GED.  Quit smoking 9 months ago. Quit drinking 9 months ago.  Denies any illicit drug use.  FAMILY HISTORY:  Positive for coronary artery disease, congestive heart failure, and diabetes in his parents; siblings are healthy.  REVIEW OF SYSTEMS:  GENERAL:  Positive for weakness and malaise.  Denies any weight loss.  HEENT:  Unremarkable.  CARDIOVASCULAR:  As in HPI. RESPIRATORY:  As in HPI.  GI:  As in HPI.  GU:  Unremarkable. NEUROLOGICAL:  Unremarkable.  PSYCHIATRIC:  Unremarkable. DERMATOLOGICAL:  Unremarkable.  Other systems reviewed and found to be negative.  PHYSICAL EXAMINATION:  VITAL SIGNS:  Temperature 97.8, blood pressure 115/64, heart rate 72, respiratory rate 16, and saturation 97% on room air. GENERAL:  An obese white male, in no distress. HEENT:  Head is normocephalic and atraumatic.  Pupils are equal and reacting.  No pallor.  No icterus.  Oral mucous membranes moist.  No oral lesions are noted. NECK:  Soft and supple.  No thyromegaly is appreciated.  No cervical, supraclavicular, or inguinal lymphadenopathy is present. LUNGS:  Clear to auscultation bilaterally with no wheezing, rales, or rhonchi. CARDIOVASCULAR:  S1 and S2 is normal and regular.  He got a systolic murmur at  the aortic area.  No bruits are heard.  No pedal edema. ABDOMEN:  Soft.  Mild tenderness in the epigastric area without any rebound, rigidity, or guarding.  No masses or organomegaly is appreciated. RECTAL:  Rectal exam done by the ED physician did show heme-positive stool. GU:  Deferred. MUSCULOSKELETAL:  Normal muscle mass and tone. NEUROLOGIC:  He is alert and oriented x3.  No focal neurological deficits are present. SKIN:  No skin rashes are noted.  LABORATORY DATA:  His white cell count is normal, hemoglobin is 8.5, MCV is 65, and platelet count is 180.  Coags are normal.  His electrolytes are normal.  LFTs are pending.  Cardiac enzymes negative.  BNP is less than 30.  IMAGING STUDIES:  He had a chest x-ray which showed no acute cardiopulmonary disease.  He had an EKG which showed sinus rhythm at 78 with normal axis. Intervals appear to be in the normal range.  No Q-waves.  No concerning ST or T-wave changes are noted.  ASSESSMENT:  This is a 40 year old Caucasian male who presents to the hospital with complaints of palpitation, was noted to have supraventricular tachycardia on monitor here which spontaneously resolved.  Then, he was found to have microcytic anemia and then he admitted to rectal bleeding.  He also has a history of acid reflux disease, has upper abdominal pain as well.  PLAN: 1. SVT.  We will check a TSH.  Echocardiogram will be ordered, though     it is unlikely, it will be done before Monday.  If it cannot be     done during this weekend and if he is stable for discharge, this     can be pursued as an outpatient.  Cardiac enzymes will be cycled.     He will be put on beta-blockers. 2. Microcytic anemia with rectal bleeding.  We will put him on PPI.     Because of his abdominal pain, we will get a CT of the abdomen and     pelvis to make sure there is nothing concerning intra-abdominal.  I     have discussed with Dr. Karilyn Cota and he plans to do endoscopy      tomorrow afternoon, most likely lower, but he may also require     upper endoscopy.  He denies any NSAID use.  Anemia panel will be     checked.  No need for transfusions at this time. 3. Abdominal pain as above.  We will also check lipase and LFTs. 4. He is a full code. 5. DVT prophylaxis.  Using SCDs.  Further management decisions will depend on results of further  testing and the patient's response to treatment.  Note that Dr. Karilyn Cota requested that I ordered him some Dulcolax to start, preparing him for his endoscopy tomorrow.     Osvaldo Shipper, MD     GK/MEDQ  D:  03/06/2010  T:  03/06/2010  Job:  [id number]  cc:   Lionel December, M.D. Fax: [phone]  Prudy Feeler, PA [facility]  Electronically Signed by Osvaldo Shipper MD on 04/05/2010 06:39:34 AM

## 2010-04-20 ENCOUNTER — Ambulatory Visit: Payer: Medicaid Other | Admitting: Cardiology

## 2010-04-21 ENCOUNTER — Ambulatory Visit: Payer: Medicaid Other | Admitting: Cardiology

## 2010-04-22 ENCOUNTER — Emergency Department (HOSPITAL_COMMUNITY)
Admission: EM | Admit: 2010-04-22 | Discharge: 2010-04-22 | Disposition: A | Discharge status: Home / Self Care | Payer: Medicaid Other | Attending: Emergency Medicine | Admitting: Emergency Medicine

## 2010-04-22 ENCOUNTER — Emergency Department (HOSPITAL_COMMUNITY): Payer: Medicaid Other

## 2010-04-22 DIAGNOSIS — Z79899 Other long term (current) drug therapy: Secondary | ICD-10-CM | POA: Insufficient documentation

## 2010-04-22 DIAGNOSIS — M2669 Other specified disorders of temporomandibular joint: Secondary | ICD-10-CM | POA: Insufficient documentation

## 2010-04-22 DIAGNOSIS — I10 Essential (primary) hypertension: Secondary | ICD-10-CM | POA: Insufficient documentation

## 2010-04-22 DIAGNOSIS — F411 Generalized anxiety disorder: Secondary | ICD-10-CM | POA: Insufficient documentation

## 2010-04-22 DIAGNOSIS — E7800 Pure hypercholesterolemia, unspecified: Secondary | ICD-10-CM | POA: Insufficient documentation

## 2010-04-22 DIAGNOSIS — K219 Gastro-esophageal reflux disease without esophagitis: Secondary | ICD-10-CM | POA: Insufficient documentation

## 2010-04-30 ENCOUNTER — Ambulatory Visit (INDEPENDENT_AMBULATORY_CARE_PROVIDER_SITE_OTHER): Payer: Medicaid Other | Admitting: Cardiology

## 2010-04-30 ENCOUNTER — Encounter: Payer: Self-pay | Admitting: Cardiology

## 2010-04-30 VITALS — BP 141/83 | HR 81 | Ht 76.0 in | Wt 284.0 lb

## 2010-04-30 DIAGNOSIS — R9430 Abnormal result of cardiovascular function study, unspecified: Secondary | ICD-10-CM

## 2010-04-30 DIAGNOSIS — F1021 Alcohol dependence, in remission: Secondary | ICD-10-CM

## 2010-04-30 DIAGNOSIS — I10 Essential (primary) hypertension: Secondary | ICD-10-CM

## 2010-04-30 DIAGNOSIS — R002 Palpitations: Secondary | ICD-10-CM

## 2010-04-30 DIAGNOSIS — K922 Gastrointestinal hemorrhage, unspecified: Secondary | ICD-10-CM | POA: Insufficient documentation

## 2010-04-30 MED ORDER — METOPROLOL SUCCINATE ER 50 MG PO TB24: 50.0000 mg | ORAL_TABLET | Freq: Every day | ORAL | Status: DC

## 2010-04-30 NOTE — Assessment & Plan Note (Signed)
Remains an active problem. He has had gastroenterology evaluation recently, with pending followup for further assessment by Dr. Gillermo Murdoch. Peptic ulcer disease noted by recent EGD. He continues on omeprazole. I do not plan to initiate aspirin at this point.

## 2010-04-30 NOTE — Assessment & Plan Note (Signed)
Noted above. Inferior defect could be related to soft tissue attenuation, and reduced LVEF is not corroborated by echocardiogram. The possibility of underlying CAD to some degree does remain a consideration however. We discussed this today, and for now will continue with beta blocker therapy and observation, particularly in light of no active chest pain or shortness of breath. While a cardiac catheterization could be considered at some point, this would likely be best deferred until the patient's active gastrointestinal bleeding is further evaluated and treated.

## 2010-04-30 NOTE — Progress Notes (Signed)
Clinical Summary [name] is a 40 y.o.male presenting for followup. Patient was seen in the office back in early March by Ms. Lawrence. He was referred with a history of palpitations and apparent supraventricular tachycardia, although this was not well documented. Beta blocker dose was increased, and a cardiac monitor was arranged as well as followup echocardiogram and stress testing.  Cardiac monitor was reviewed showing sinus rhythm, heart rate range 48-129 beats per minute, rare PVCs and PACs, no pauses or sustained arrhythmias. Echocardiogram and stress test report are outlined below. LVEF is in the low-normal range, no major valvular abnormalities. We discussed the abnormalities noted on stress testing, some of which may be related to artifact. The possibility of underlying CAD to some degree is to be considered however.  He does not endorse any exertional chest pain or unusual shortness of breath, describes few if any palpitations, certainly nothing like his original symptoms. He seems to be tolerating beta blocker therapy.  More concerning, he continues to endorse blood in his stools on a regular basis. He has followup pending with Dr. Gillermo Murdoch for additional evaluation. He is not on aspirin.   No Known Allergies  Current outpatient prescriptions:ALPRAZolam (XANAX) 0.25 MG tablet, Take 0.25 mg by mouth at bedtime as needed.  , Disp: , Rfl: ;  Choline Fenofibrate (TRILIPIX) 135 MG capsule, Take 135 mg by mouth daily.  , Disp: , Rfl: ;  ferrous sulfate 325 (65 FE) MG tablet, Take 325 mg by mouth daily with breakfast.  , Disp: , Rfl: ;  HYDROcodone-acetaminophen (VICODIN) 5-500 MG per tablet, Take 1 tablet by mouth every 6 (six) hours as needed.  , Disp: , Rfl:  metoprolol succinate (TOPROL-XL) 50 MG 24 hr tablet, Take 1 tablet (50 mg total) by mouth daily., Disp: 30 tablet, Rfl: 3;  omeprazole (PRILOSEC) 20 MG capsule, Take 20 mg by mouth daily.  , Disp: , Rfl: ;  DISCONTD: metoprolol succinate  (TOPROL-XL) 25 MG 24 hr tablet, Take 25 mg by mouth daily.  , Disp: , Rfl:   Past Medical History  Diagnosis Date  . Anxiety   . Anemia     GI bleeding - required transfusion  . GERD (gastroesophageal reflux disease)   . Peptic ulcer disease     EGD 2012  . History of colonic polyps     Colonoscopy 2012  . Supraventricular tachycardia     Social History Mr. Goldammer reports that he quit smoking about 10 months ago. His smoking use included Cigarettes. He has never used smokeless tobacco. Mr. Quesnell reports that he does not drink alcohol.  Review of Systems As outlined above, otherwise reviewed and negative.  Physical Examination Filed Vitals:   04/30/10 0859  BP: 141/83  Pulse: 81  Tall, overweight male in no acute distress. HEENT: Conjunctiva and lids normal, oropharynx with poor dentition. Neck: Supple, no elevated JVP or carotid bruits. No thyromegaly. Lungs: Clear to auscultation, nonlabored. Cardiac: Regular rate and rhythm, no S3 or pericardial rub. Abdomen: Soft, nontender, bowel sounds present. Skin: Warm and dry, scattered tattoos. Musculoskeletal: No gross deformities. Extremities: No pitting edema, distal pulses full. Neuropsychiatric: Alert and oriented x3, affect appropriate.    Studies Exercise Myoview 03/24/2010: No diagnostic ST segment changes with hypertensive response, shortness of breath and chest pain reported. No inducible arrhythmias. Fixed inferior/inferior septal defect suggestive of either scar or soft tissue attenuation. No evidence of ischemia. LVEF 44% with global hypokinesis.  Echocardiogram 03/24/2010: Mild LVH with LVEF 50-55%, diastolic dysfunction, trivial  mitral regurgitation, mild left atrial enlargement, trivial tricuspid regurgitation, trivial pericardial effusion.  Problem List and Plan

## 2010-04-30 NOTE — Assessment & Plan Note (Signed)
Patient states that he has been free from alcohol for approximately one year. Also not smoking cigarettes at this time. Echocardiogram demonstrates low-normal LVEF of 50-55%.

## 2010-04-30 NOTE — Assessment & Plan Note (Signed)
Blood pressure up today, also noted previously. Sodium restriction has been discussed, also weight loss.

## 2010-04-30 NOTE — Patient Instructions (Signed)
Your physician recommends that you schedule a follow-up appointment in: 3 months Your physician has recommended you make the following change in your medication: increase Metoprolol to 50mg  by month everyday

## 2010-04-30 NOTE — Assessment & Plan Note (Signed)
Improved. Plan to further advance Toprol-XL 50 mg daily for now.

## 2010-05-11 ENCOUNTER — Telehealth: Payer: Self-pay | Admitting: Cardiology

## 2010-05-11 NOTE — Telephone Encounter (Signed)
I reviewed his recent office visit note from 12 April. If he remains clinically stable on medical therapy, he should be able to proceed with a hernia repair under general anesthesia at an acceptable perioperative cardiac risk. Recent stress testing and echocardiogram do not exclude the potential for underlying CAD, although no large ischemic defects were seen, and his LVEF is 50-55%. He should continue medical therapy throughout, particularly beta blocker therapy. Our service can be consulted as needed.

## 2010-05-11 NOTE — Telephone Encounter (Signed)
Needs clearance for Left Inguinal Repair w/ General Anesthesia / tg

## 2010-05-12 NOTE — Telephone Encounter (Signed)
**Note De-Identified Maximiano Lott Obfuscation** This note faxed to Dr. Rupert Stacks office @ [phone].

## 2010-05-15 ENCOUNTER — Telehealth: Payer: Self-pay

## 2010-05-15 ENCOUNTER — Telehealth: Payer: Self-pay | Admitting: Cardiology

## 2010-05-15 NOTE — Telephone Encounter (Signed)
Patient states that he has noticed that he has 2 BP meds and wants to know if he is supposed to be taking both of them / tg

## 2010-05-18 ENCOUNTER — Ambulatory Visit (INDEPENDENT_AMBULATORY_CARE_PROVIDER_SITE_OTHER): Payer: Medicaid Other | Admitting: Internal Medicine

## 2010-05-18 DIAGNOSIS — D509 Iron deficiency anemia, unspecified: Secondary | ICD-10-CM

## 2010-05-18 DIAGNOSIS — K921 Melena: Secondary | ICD-10-CM

## 2010-05-18 DIAGNOSIS — K270 Acute peptic ulcer, site unspecified, with hemorrhage: Secondary | ICD-10-CM

## 2010-05-19 ENCOUNTER — Other Ambulatory Visit: Payer: Self-pay | Admitting: General Surgery

## 2010-05-19 ENCOUNTER — Encounter (HOSPITAL_COMMUNITY): Payer: Medicaid Other

## 2010-05-19 ENCOUNTER — Other Ambulatory Visit (HOSPITAL_COMMUNITY): Payer: Medicaid Other

## 2010-05-19 LAB — DIFFERENTIAL
Basophils Relative: 2 % — ABNORMAL HIGH (ref 0–1)
Eosinophils Absolute: 0.3 10*3/uL (ref 0.0–0.7)
Lymphs Abs: 2.2 10*3/uL (ref 0.7–4.0)
Monocytes Absolute: 0.5 10*3/uL (ref 0.1–1.0)
Monocytes Relative: 9 % (ref 3–12)
Neutrophils Relative %: 48 % (ref 43–77)

## 2010-05-19 LAB — BASIC METABOLIC PANEL
BUN: 17 mg/dL (ref 6–23)
CO2: 26 mEq/L (ref 19–32)
Calcium: 9.9 mg/dL (ref 8.4–10.5)
Glucose, Bld: 119 mg/dL — ABNORMAL HIGH (ref 70–99)
Sodium: 139 mEq/L (ref 135–145)

## 2010-05-19 LAB — CBC
HCT: 39.9 % (ref 39.0–52.0)
Hemoglobin: 12.4 g/dL — ABNORMAL LOW (ref 13.0–17.0)
MCH: 25.5 pg — ABNORMAL LOW (ref 26.0–34.0)
MCHC: 31.1 g/dL (ref 30.0–36.0)
MCV: 81.9 fL (ref 78.0–100.0)
RBC: 4.87 MIL/uL (ref 4.22–5.81)

## 2010-05-22 ENCOUNTER — Ambulatory Visit (HOSPITAL_COMMUNITY)
Admission: RE | Admit: 2010-05-22 | Discharge: 2010-05-22 | Disposition: A | Discharge status: Home / Self Care | Payer: Medicaid Other | Source: Ambulatory Visit | Attending: General Surgery | Admitting: General Surgery

## 2010-05-22 DIAGNOSIS — I10 Essential (primary) hypertension: Secondary | ICD-10-CM | POA: Insufficient documentation

## 2010-05-22 DIAGNOSIS — K219 Gastro-esophageal reflux disease without esophagitis: Secondary | ICD-10-CM | POA: Insufficient documentation

## 2010-05-22 DIAGNOSIS — F411 Generalized anxiety disorder: Secondary | ICD-10-CM | POA: Insufficient documentation

## 2010-05-22 DIAGNOSIS — Z01812 Encounter for preprocedural laboratory examination: Secondary | ICD-10-CM | POA: Insufficient documentation

## 2010-05-22 DIAGNOSIS — Z79899 Other long term (current) drug therapy: Secondary | ICD-10-CM | POA: Insufficient documentation

## 2010-05-22 DIAGNOSIS — Z130 Encounter for screening for diseases of the blood and blood-forming organs and certain disorders involving the immune mechanism: Secondary | ICD-10-CM | POA: Insufficient documentation

## 2010-05-22 DIAGNOSIS — K4090 Unilateral inguinal hernia, without obstruction or gangrene, not specified as recurrent: Secondary | ICD-10-CM | POA: Insufficient documentation

## 2010-05-22 DIAGNOSIS — G4733 Obstructive sleep apnea (adult) (pediatric): Secondary | ICD-10-CM | POA: Insufficient documentation

## 2010-05-25 NOTE — Op Note (Signed)
[name], [name]               ACCOUNT NO.:  [id number]  MEDICAL RECORD NO.:  [id number]           PATIENT TYPE:  O  LOCATION:  DAYL                         FACILITY:  [facility]  PHYSICIAN:  Lennie Muckle, MD      DATE OF BIRTH:  22-Nov-1970  DATE OF PROCEDURE:  05/22/2010 DATE OF DISCHARGE:  05/22/2010                              OPERATIVE REPORT   PREOPERATIVE DIAGNOSIS:  Left inguinal hernia.  POSTOPERATIVE DIAGNOSIS:  Left inguinal hernia.  PROCEDURE:  Laparoscopic left inguinal hernia.  SURGEON:  Lennie Muckle, MD  ASSISTANT:  OR staff.  ANESTHESIA:  General endotracheal.  BLOOD LOSS:  Minimal.  No immediate complications.  INDICATIONS FOR THE PROCEDURE:  Mr. Weller is a 40 year old male who had had a bulge in his left groin for some time.  He began to have discomfort in the left groin.  Examination was consistent with the left inguinal hernia.  Due to his discomfort, he desired to have a repair of this hernia.  We discussed possibility of umbilical hernia repair, however, he did not have much discomfort from this.  He wanted to proceed with laparoscopic repair.  DETAILS OF PROCEDURE:  The patient was identified in the preoperative holding area, left groin was marked by me.  He was then taken to the operating room and was given IV antibiotics.  Once in the operating room, he was placed in the supine position.  Sequential compression devices were applied to his lower extremity.  A Foley catheter was placed after administration of general endotracheal anesthesia.  His abdomen was prepped and draped in usual sterile fashion.  Surgical time- out performed.  I began by placing an incision beneath the umbilical region.  I dissected through the anterior rectus fascia, this was incised with #11 blade.  I finger dissected in the preperitoneal space, placed the Spacemaker plus balloon in the preperitoneal space and insufflated this while visualized with camera.  After  insufflating the balloon approximately 30 times, I retracted it cranially.  I then re- insufflated under inspection of the camera.  I then removed the Spacemaker Plus balloon and placed laparoscopic port.  I insufflated the preperitoneal space.  There was a limited amount of space in pre- peritoneum.  The balloon did not dissect the tissues much.  I then placed two additional 5-mm trocars at the midline under visualization of the camera.  I began by dissecting on the lateral wall.  I pushed down the peritoneum inferiorly.  I had to dissect through the layers of the abdominal wall.  This made a small rent in the peritoneum, I placed a PDS loop around this.  Some of the CO2 did escape into the abdomen.  I placed a 5-mm trocar into the abdominal cavity while visualizing with the OptiVu.  All layers of abdominal wall were visualized upon entry. There was a small amount of gas within the abdominal cavity.  There was no injury upon placement of trocar.  I left the trocar opened through the case.  I then went back to the preperitoneal space.  I carried down towards the internal ring and dissected  the hernia sac away from the spermatic cord vessels.  However, there was a large hernia defect medially.  I continued dissecting the hernia sac and took some time and effort, I was able to reduce the hernia sac without injury to the iliac vessels.  I cut through the hernia sac with laparoscopic scissors to fully divide this.  I pushed the peritoneum inferiorly and was able to get a free space around the internal ring and the direct hernia defect. I then chose a piece of 6 x 6 Ultrapro mesh.  I placed this in the preperitoneal space, tacked it around the internal ring and the rectus ligament.  I placed a tack at [facility] ligament, however, the suture strap tacks did not hold.  I used some of the tacks by [employer] at [place] ligament around the internal ring and then laterally while palpating the protract  device.  I then attempted to hold down the inferior edge of the mesh and straighten this while allowing the peritoneum to come up around the mesh.  This was rather difficult to due to his large amount of abdominal preperitoneal fat and the laxity in the peritoneum.  However, I was able to place this in good position around the internal ring, allowed the pre-peritoneum to come up around the mesh inferiorly.  I then removed all the pneumoperitoneum within the abdomen through the trocars and closed the fascial defect at the umbilical region with 0 Vicryl suture.  A 30 mL of 0.25% Marcaine were used for local block. The patient was extubated and transferred to postanesthesia care unit in stable condition.  He will be discharged home and then follow up with me in approximately three weeks' time.     Lennie Muckle, MD     ALA/MEDQ  D:  05/22/2010  T:  05/23/2010  Job:  [id number]  cc:   Prudy Feeler, [place]  Electronically Signed by Bertram Savin MD on 05/25/2010 10:52:19 AM

## 2010-08-18 ENCOUNTER — Encounter (INDEPENDENT_AMBULATORY_CARE_PROVIDER_SITE_OTHER): Payer: Self-pay

## 2010-08-20 ENCOUNTER — Telehealth (INDEPENDENT_AMBULATORY_CARE_PROVIDER_SITE_OTHER): Payer: Self-pay | Admitting: *Deleted

## 2010-08-20 DIAGNOSIS — D649 Anemia, unspecified: Secondary | ICD-10-CM

## 2010-08-20 NOTE — Telephone Encounter (Signed)
Lab order faxed and letter sent to the patient..  

## 2010-09-10 ENCOUNTER — Ambulatory Visit (INDEPENDENT_AMBULATORY_CARE_PROVIDER_SITE_OTHER): Payer: Medicaid Other | Admitting: Internal Medicine

## 2010-10-13 LAB — COMPREHENSIVE METABOLIC PANEL
Alkaline Phosphatase: 89
BUN: 12
Chloride: 102
Creatinine, Ser: 0.92
GFR calc non Af Amer: >60
Glucose, Bld: 114 — ABNORMAL HIGH
Potassium: 3.7
Total Bilirubin: 0.8

## 2010-10-13 LAB — B-NATRIURETIC PEPTIDE (CONVERTED LAB): Pro B Natriuretic peptide (BNP): <30

## 2010-10-13 LAB — CBC
HCT: 29.6 — ABNORMAL LOW
Hemoglobin: 9.1 — ABNORMAL LOW
MCHC: 30.7
MCV: 63 — ABNORMAL LOW
Platelets: 199
RBC: 4.7
RDW: 18 — ABNORMAL HIGH
WBC: 4.8

## 2010-10-13 LAB — COMPREHENSIVE METABOLIC PANEL WITH GFR
ALT: 22
AST: 22
Albumin: 3.9
CO2: 25
Calcium: 9.1
GFR calc Af Amer: >60
Sodium: 136
Total Protein: 6.8

## 2010-10-13 LAB — DIFFERENTIAL
Basophils Absolute: 0.1
Basophils Relative: 2 — ABNORMAL HIGH
Eosinophils Absolute: 0.2
Eosinophils Relative: 3
Lymphocytes Relative: 32
Lymphs Abs: 1.5
Monocytes Absolute: 0.4
Monocytes Relative: 9
Neutro Abs: 2.6
Neutrophils Relative %: 54

## 2010-10-13 LAB — POCT CARDIAC MARKERS
CKMB, poc: 1.2
Myoglobin, poc: 42.3
Operator id: 216471
Troponin i, poc: <0.05

## 2010-10-13 LAB — D-DIMER, QUANTITATIVE: D-Dimer, Quant: 0.22

## 2011-07-05 ENCOUNTER — Telehealth (INDEPENDENT_AMBULATORY_CARE_PROVIDER_SITE_OTHER): Payer: Self-pay | Admitting: *Deleted

## 2011-07-05 ENCOUNTER — Other Ambulatory Visit (INDEPENDENT_AMBULATORY_CARE_PROVIDER_SITE_OTHER): Payer: Self-pay | Admitting: Internal Medicine

## 2011-07-05 MED ORDER — HYDROCORTISONE ACETATE 25 MG RE SUPP: 25.0000 mg | Freq: Every day | RECTAL | Status: AC

## 2011-07-05 NOTE — Telephone Encounter (Signed)
Patient called and left a message asking that Dr.Rehman call in another prescription for Suppositories for his hemorrhoids. Also ask for about a referral being made to a doctor for his hemorrhoids. Per Dr.Rehman a [facility] [place], The patient will need to have an office appointment with our office prior to a referral being made. Patient called and made aware , transferred to Lupita Leash to make the office appointment

## 2011-07-08 ENCOUNTER — Ambulatory Visit (INDEPENDENT_AMBULATORY_CARE_PROVIDER_SITE_OTHER): Payer: Medicaid Other | Admitting: Internal Medicine

## 2011-08-03 ENCOUNTER — Ambulatory Visit (INDEPENDENT_AMBULATORY_CARE_PROVIDER_SITE_OTHER): Payer: Medicaid Other | Admitting: General Surgery

## 2011-08-03 ENCOUNTER — Encounter (INDEPENDENT_AMBULATORY_CARE_PROVIDER_SITE_OTHER): Payer: Self-pay | Admitting: General Surgery

## 2011-08-03 VITALS — BP 100/50 | HR 73 | Temp 98.4°F | Ht 77.0 in | Wt 269.0 lb

## 2011-08-03 DIAGNOSIS — K644 Residual hemorrhoidal skin tags: Secondary | ICD-10-CM

## 2011-08-03 DIAGNOSIS — K648 Other hemorrhoids: Secondary | ICD-10-CM

## 2011-08-03 MED ORDER — HYDROCORTISONE ACETATE 25 MG RE SUPP: 25.0000 mg | Freq: Two times a day (BID) | RECTAL | Status: AC

## 2011-08-03 NOTE — Patient Instructions (Signed)
Start taking stool softeners (colace 100 mg twice daily)  Take suppositories.  Add warm water baths twice daily.

## 2011-08-03 NOTE — Progress Notes (Signed)
Patient ID: [name], male   DOB: Nov 20, 1970, 41 y.o.   MRN: [id number]  Chief Complaint  Patient presents with  . Other    throm hems  . Rectal Problems  . Rectal Bleeding    HPI [name] is a 41 y.o. male.   HPI Patient's 41 year old male who presents with 14 years of hemorrhoid difficulty. He has currently had such bad bleeding that he has to get into a cold shower to get the bleeding to stop. He had a colonoscopy a year ago by Dr. Gerilyn Nestle in [place]. He had no other cause of bleeding other than internal and external hemorrhoids. He has tried suppositories for 2 weeks at night with no effect. He describes minimal pain. He does have a sense of something coming out of the bottom that he sometimes has to push back in. He denies being constipated. He has a bowel movement every day. He denies feeling a significant amount on the toilet. He does not have to strain. He has not tried fiber supplements and stool softeners.  Past Medical History  Diagnosis Date  . Anxiety   . Anemia     GI bleeding - required transfusion  . GERD (gastroesophageal reflux disease)   . Peptic ulcer disease     EGD 2012  . History of colonic polyps     Colonoscopy 2012  . Supraventricular tachycardia   . Blood transfusion   . Hyperlipidemia     Past Surgical History  Procedure Date  . Steel rod left leg   . Appendectomy   . Hernia repair 2012  . Rotator cuff repair 2002    right    Family History  Problem Relation Age of Onset  . Cancer Mother     lung  . Cancer Maternal Uncle     lung  . Cancer Paternal Uncle     throat  . Cancer Cousin     colon  . Cancer Paternal Uncle     mouth    Social History History  Substance Use Topics  . Smoking status: Former Smoker    Types: Cigarettes    Quit date: 06/18/2009  . Smokeless tobacco: Never Used  . Alcohol Use: No     Former regular use    No Known Allergies  Current Outpatient Prescriptions  Medication Sig Dispense Refill    . ALPRAZolam (XANAX) 1 MG tablet       . ferrous sulfate 325 (65 FE) MG tablet Take 325 mg by mouth daily with breakfast.        . HYDROcodone-acetaminophen (VICODIN) 5-500 MG per tablet Take 1 tablet by mouth every 6 (six) hours as needed.        . olanzapine-FLUoxetine (SYMBYAX) 6-25 MG per capsule Take 1 capsule by mouth every evening.      Marland Kitchen omeprazole (PRILOSEC) 20 MG capsule Take 20 mg by mouth daily.        . hydrocortisone (ANUSOL-HC) 25 MG suppository Place 1 suppository (25 mg total) rectally 2 (two) times daily.  28 suppository  3    Review of Systems Review of Systems  HENT: Positive for congestion.   Eyes: Positive for visual disturbance.  Gastrointestinal: Positive for abdominal pain, blood in stool and anal bleeding.  Neurological: Positive for weakness.    Blood pressure 100/50, pulse 73, temperature 98.4 F (36.9 C), temperature source Temporal, height 6\' 5"  (1.956 m), weight 269 lb (122.018 kg), SpO2 98.00%.  Physical Exam Physical Exam  Constitutional:  He is oriented to person, place, and time. He appears well-developed and well-nourished. No distress.  HENT:  Head: Normocephalic and atraumatic.  Eyes: Pupils are equal, round, and reactive to light. No scleral icterus.  Neck: Normal range of motion.  Cardiovascular: Normal rate.   Pulmonary/Chest: Effort normal. No respiratory distress.  Abdominal: Soft.  Genitourinary: Rectal exam shows external hemorrhoid and internal hemorrhoid.       Flat external hemorrhoids on right side.  Very large pedunculated hemorrhoid on right posterior aspect.  Circumferential internal hemorrhoids.    Musculoskeletal: Normal range of motion. He exhibits no edema and no tenderness.  Neurological: He is alert and oriented to person, place, and time. Coordination normal.  Skin: Skin is warm and dry. No rash noted. He is not diaphoretic. No erythema. No pallor.  Psychiatric: He has a normal mood and affect. His behavior is normal.  Judgment and thought content normal.    Assessment/Plan  Internal and external bleeding hemorrhoids Injected internal hemorrhoids circumferentially. Advise using stool softeners twice daily in addition to suppositories. Advising to do sitz bath twice daily.  He has a very pedunculated and internal hemorrhoid and may require exam under anesthesia and resection of this internal hemorrhoid.          Moustapha Tooker 08/03/2011, 4:58 PM

## 2011-08-03 NOTE — Assessment & Plan Note (Signed)
Injected internal hemorrhoids circumferentially. Advise using stool softeners twice daily in addition to suppositories. Advising to do sitz bath twice daily.  He has a very pedunculated and internal hemorrhoid and may require exam under anesthesia and resection of this internal hemorrhoid.

## 2011-08-12 ENCOUNTER — Encounter (INDEPENDENT_AMBULATORY_CARE_PROVIDER_SITE_OTHER): Payer: Self-pay

## 2011-08-17 ENCOUNTER — Ambulatory Visit (INDEPENDENT_AMBULATORY_CARE_PROVIDER_SITE_OTHER): Payer: Medicaid Other | Admitting: Internal Medicine

## 2011-09-10 ENCOUNTER — Ambulatory Visit (INDEPENDENT_AMBULATORY_CARE_PROVIDER_SITE_OTHER): Payer: Medicaid Other | Admitting: General Surgery

## 2011-09-10 ENCOUNTER — Encounter (INDEPENDENT_AMBULATORY_CARE_PROVIDER_SITE_OTHER): Payer: Self-pay | Admitting: General Surgery

## 2011-09-10 VITALS — BP 114/78 | HR 83 | Temp 98.3°F | Ht 77.0 in | Wt 282.4 lb

## 2011-09-10 DIAGNOSIS — K644 Residual hemorrhoidal skin tags: Secondary | ICD-10-CM

## 2011-09-10 DIAGNOSIS — K648 Other hemorrhoids: Secondary | ICD-10-CM

## 2011-09-10 MED ORDER — HYDROCORTISONE ACETATE 25 MG RE SUPP: 25.0000 mg | Freq: Two times a day (BID) | RECTAL | Status: AC

## 2011-09-10 NOTE — Patient Instructions (Signed)
Refill suppositories.  Take stool softener at least once/day.  Can take twice daily.

## 2011-09-10 NOTE — Progress Notes (Signed)
HISTORY: He is a 41 year old male who I saw around 8 weeks ago for hemorrhoids. He was having such severe bleeding then he had to be hospitalized previously. He is bleeding around 98% of the time he went to the bathroom. Even if he didn't have a bowel movement he was having dripping of blood into the toilet bowl.  Since I injected him on his last visit he is only rarely having bleeding. He usually has a bowel movement every day. He sometimes has to strain. He has taken some stool softeners since I saw him but has not taken them daily or every other day. He has used a few suppositories but has not done that regularly either.  PERTINENT REVIEW OF SYSTEMS:    EXAM: Head: Normocephalic and atraumatic.  Eyes:  Conjunctivae are normal. Pupils are equal, round, and reactive to light. No scleral icterus.  Neck:  Normal range of motion. Neck supple. No tracheal deviation present.  Resp: No respiratory distress, normal effort. Abd:  Abdomen is soft, non distended and non tender. No masses are palpable.  There is no rebound and no guarding.  Rectal:  Flat external hemorrhoids on right.  Anoscopy reveals large friable internal hemorrhoids.  Injected circumferentially.   Neurological: Alert and oriented to person, place, and time. Coordination normal.  Skin: Skin is warm and dry. No rash noted. No diaphoretic. No erythema. No pallor.  Psychiatric: Normal mood and affect. Normal behavior. Judgment and thought content normal.      ASSESSMENT AND PLAN:   Internal and external bleeding hemorrhoids Reinjected.  Much improved  Refill suppositories. Take stool softeners daily or twice daily.        Maudry Diego, MD Surgical Oncology, General & Endocrine Surgery [facility]  [place], [place], DO Samuel Jester, DO

## 2011-09-10 NOTE — Assessment & Plan Note (Signed)
Reinjected.  Much improved  Refill suppositories. Take stool softeners daily or twice daily.

## 2012-02-13 IMAGING — NM NM MYOCAR SINGLE W/SPECT W/WALL MOTION & EF
2 series · 12 of 12 positions shown · non-contrast
Comparison: none

Ordering Physician: Rafal Oommen, NP

Reading Physician: [REDACTED]al Data: 39-year-old male with reported history of SVT,
referred for the evaluation of ischemia.
NUCLEAR MEDICINE STRESS MYOVIEW STUDY WITH SPECT AND LEFT
VENTRICULAR EJECTION FRACTION
Radionuclide Data: One-day rest/stress protocol performed with
[DATE] mCi of Dc-OOm Myoview.
Stress Data: The patient was exercised on a standard Bruce protocol
for 7 minutes and 42 seconds achieving a maximum workload of
mets.  Heart rate increased from 74 beats per minute up to 181
beats per minute which was 101% of the maximum age predicted heart
rate response.  Blood pressure increased from 120/70 up to 170/80.
The patient reported shortness of breath as well as left-sided and
axillary discomfort at peak heart rate, also palpitations.  Lead
motion artifact is noted.  No clearly diagnostic ST-segment changes
were seen by standard criteria, nor were there any inducible
arrhythmias.
EKG: Resting ECG shows sinus rhythm at 76 beats per minute.
Scintigraphic Data: Analysis of the raw perfusion data finds
evidence of diaphragmatic attenuation.
Tomographic views were obtained using the short axis, vertical long
axis, and horizontal long axis planes.  There is a mild to moderate
inferior defect, more intense noted in the inferior septal
distribution principally at rest.  This defect is not reversible
and suggests either scar or soft tissue attenuation.
Gated imaging reveals an EDV of 176, ESV of 99, T I D ratio of 1.1,
and LVEF of 44% with global hypokinesis.

[Series 1: cardiac rest stress · 6.39mm/px · 6 of 64 frames shown (1 of 2)]
[frame 6/64]
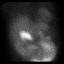
[frame 16/64]
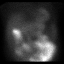
[frame 27/64]
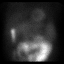
[frame 38/64]
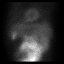
[frame 48/64]
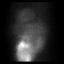
[frame 59/64]
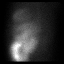

[Series 1: cardiac rest stress · 6.39mm/px · 6 of 511 frames shown (2 of 2)]
[frame 43/511]
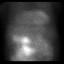
[frame 128/511]
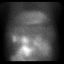
[frame 213/511]
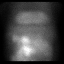
[frame 298/511]
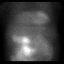
[frame 383/511]
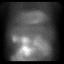
[frame 469/511]
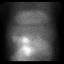

[12 of 12 positions shown; findings below may reference images not displayed]

IMPRESSION: Abnormal exercise Myoview as outlined.  There were no clearly
diagnostic ST-segment changes by standard criteria.  Hypertensive
response to exercise with symptoms reported above.  No inducible
arrhythmias were noted.  Perfusion imaging shows an
inferior/inferoseptal defect that is fixed, suggesting either scar
or soft tissue attenuation, without clear evidence of ischemia.
LVEF is reduced at 44% with global hypokinesis.

## 2012-03-10 ENCOUNTER — Emergency Department (HOSPITAL_COMMUNITY): Payer: Medicaid Other

## 2012-03-10 ENCOUNTER — Encounter (HOSPITAL_COMMUNITY): Payer: Self-pay | Admitting: *Deleted

## 2012-03-10 ENCOUNTER — Emergency Department (HOSPITAL_COMMUNITY)
Admission: EM | Admit: 2012-03-10 | Discharge: 2012-03-10 | Disposition: A | Discharge status: Home / Self Care | Payer: Medicaid Other | Attending: Emergency Medicine | Admitting: Emergency Medicine

## 2012-03-10 DIAGNOSIS — F411 Generalized anxiety disorder: Secondary | ICD-10-CM | POA: Insufficient documentation

## 2012-03-10 DIAGNOSIS — Z862 Personal history of diseases of the blood and blood-forming organs and certain disorders involving the immune mechanism: Secondary | ICD-10-CM | POA: Insufficient documentation

## 2012-03-10 DIAGNOSIS — Z8639 Personal history of other endocrine, nutritional and metabolic disease: Secondary | ICD-10-CM | POA: Insufficient documentation

## 2012-03-10 DIAGNOSIS — Y929 Unspecified place or not applicable: Secondary | ICD-10-CM | POA: Insufficient documentation

## 2012-03-10 DIAGNOSIS — D649 Anemia, unspecified: Secondary | ICD-10-CM | POA: Insufficient documentation

## 2012-03-10 DIAGNOSIS — N509 Disorder of male genital organs, unspecified: Secondary | ICD-10-CM | POA: Insufficient documentation

## 2012-03-10 DIAGNOSIS — Z860100 Personal history of colon polyps, unspecified: Secondary | ICD-10-CM | POA: Insufficient documentation

## 2012-03-10 DIAGNOSIS — K219 Gastro-esophageal reflux disease without esophagitis: Secondary | ICD-10-CM | POA: Insufficient documentation

## 2012-03-10 DIAGNOSIS — Y939 Activity, unspecified: Secondary | ICD-10-CM | POA: Insufficient documentation

## 2012-03-10 DIAGNOSIS — M549 Dorsalgia, unspecified: Secondary | ICD-10-CM

## 2012-03-10 DIAGNOSIS — Z8711 Personal history of peptic ulcer disease: Secondary | ICD-10-CM | POA: Insufficient documentation

## 2012-03-10 DIAGNOSIS — IMO0002 Reserved for concepts with insufficient information to code with codable children: Secondary | ICD-10-CM | POA: Insufficient documentation

## 2012-03-10 DIAGNOSIS — Z87891 Personal history of nicotine dependence: Secondary | ICD-10-CM | POA: Insufficient documentation

## 2012-03-10 DIAGNOSIS — Z8679 Personal history of other diseases of the circulatory system: Secondary | ICD-10-CM | POA: Insufficient documentation

## 2012-03-10 DIAGNOSIS — Z87448 Personal history of other diseases of urinary system: Secondary | ICD-10-CM | POA: Insufficient documentation

## 2012-03-10 DIAGNOSIS — W010XXA Fall on same level from slipping, tripping and stumbling without subsequent striking against object, initial encounter: Secondary | ICD-10-CM | POA: Insufficient documentation

## 2012-03-10 LAB — URINALYSIS, ROUTINE W REFLEX MICROSCOPIC
Ketones, ur: NEGATIVE mg/dL
Leukocytes, UA: NEGATIVE
Nitrite: NEGATIVE
Protein, ur: NEGATIVE mg/dL
Urobilinogen, UA: 0.2 mg/dL (ref 0.0–1.0)

## 2012-03-10 MED ORDER — OXYCODONE-ACETAMINOPHEN 5-325 MG PO TABS
2.0000 | ORAL_TABLET | Freq: Once | ORAL | Status: AC
  Administered 2012-03-10: 2 via ORAL
  Filled 2012-03-10 (×2): qty 2

## 2012-03-10 MED ORDER — IBUPROFEN 800 MG PO TABS: 800.0000 mg | ORAL_TABLET | Freq: Three times a day (TID) | ORAL | Status: DC

## 2012-03-10 MED ORDER — OXYCODONE-ACETAMINOPHEN 5-325 MG PO TABS: 2.0000 | ORAL_TABLET | ORAL | Status: DC | PRN

## 2012-03-10 NOTE — ED Notes (Signed)
Medication given per order. Pt identified and scanned, medication scanned. Scan did not carry over to system. Computer rebooted.

## 2012-03-10 NOTE — ED Provider Notes (Signed)
History     This chart was scribed for Glynn Octave, MD, MD by Smitty Pluck, ED Scribe. The patient was seen in room APFT23/APFT23 and the patient's care was started at 1:34 PM.   CSN: [id number]  Arrival date & time 03/10/12  1321      Chief Complaint  Patient presents with  . Back Pain    (Consider location/radiation/quality/duration/timing/severity/associated sxs/prior treatment) The history is provided by the patient and medical records. No language interpreter was used.   [name] is a 42 y.o. male who presents to the Emergency Department complaining of constant, moderate lower lumbar pain radiating to bilateral legs onset 1 day ago due to slipping in mud. Pt reports that he landed on buttocks on ground during fall. He reports that he has taken advil without relief. He states having left and right testicular pain. Pt mentions hx of prostatitis. He is unsure if he hit his testicles during the fall. He denies urinary incontinence, bowel incontinence, dysuria, numbness in lower extremities, weakness and any other pain.   PCP is Dr. Prudy Feeler  Past Medical History  Diagnosis Date  . Anxiety   . Anemia     GI bleeding - required transfusion  . GERD (gastroesophageal reflux disease)   . Peptic ulcer disease     EGD 2012  . History of colonic polyps     Colonoscopy 2012  . Supraventricular tachycardia   . Blood transfusion   . Hyperlipidemia     Past Surgical History  Procedure Laterality Date  . Steel rod left leg    . Appendectomy    . Hernia repair  2012  . Rotator cuff repair  2002    right    Family History  Problem Relation Age of Onset  . Cancer Mother     lung  . Cancer Maternal Uncle     lung  . Cancer Paternal Uncle     throat  . Cancer Cousin     colon  . Cancer Paternal Uncle     mouth    History  Substance Use Topics  . Smoking status: Former Smoker    Types: Cigarettes    Quit date: 06/18/2009  . Smokeless tobacco: Never Used   . Alcohol Use: No     Comment: Former regular use      Review of Systems 10 Systems reviewed and all are negative for acute change except as noted in the HPI.   Allergies  Review of patient's allergies indicates no known allergies.  Home Medications   Current Outpatient Rx  Name  Route  Sig  Dispense  Refill  . ALPRAZolam (XANAX) 1 MG tablet               . ferrous sulfate 325 (65 FE) MG tablet   Oral   Take 325 mg by mouth daily with breakfast.           . HYDROcodone-acetaminophen (VICODIN) 5-500 MG per tablet   Oral   Take 1 tablet by mouth every 6 (six) hours as needed.           . olanzapine-FLUoxetine (SYMBYAX) 6-25 MG per capsule   Oral   Take 1 capsule by mouth every evening.         Marland Kitchen omeprazole (PRILOSEC) 20 MG capsule   Oral   Take 20 mg by mouth daily.             BP 137/89  Pulse 106  Temp(Src)  98.2 F (36.8 C) (Oral)  Ht 6\' 5"  (1.956 m)  Wt 280 lb (127.007 kg)  BMI 33.2 kg/m2  SpO2 100%  Physical Exam  Nursing note and vitals reviewed. Constitutional: He is oriented to person, place, and time. He appears well-developed and well-nourished. No distress.  HENT:  Head: Normocephalic and atraumatic.  Eyes: EOM are normal. Pupils are equal, round, and reactive to light.  Neck: Normal range of motion. Neck supple. No tracheal deviation present.  Cardiovascular: Normal rate, regular rhythm and normal heart sounds.   Pulmonary/Chest: Effort normal and breath sounds normal. No respiratory distress. He has no wheezes. He has no rales.  Abdominal: Soft. He exhibits no distension.  Genitourinary:  Testicles and epididymis nontender Nl rectal tone Prostate nontender Rectal exam chaperoned by scribe.   Musculoskeletal: Normal range of motion.  Midline lower lumbar tenderness No stepoff 5/5 strength in bilateral lower ext Ankle plantar and dorsi flexion intact Great toe extension intact +2 dp/pt pulses Straight leg raise posititve  bilaterally  +2 patellar reflexes Nl gait   Neurological: He is alert and oriented to person, place, and time.  Skin: Skin is warm and dry.  Psychiatric: He has a normal mood and affect. His behavior is normal.    ED Course  Procedures (including critical care time) DIAGNOSTIC STUDIES: Oxygen Saturation is 100% on room air, normal by my interpretation.    COORDINATION OF CARE: 1:41 PM Discussed ED treatment with pt and pt agrees.  1:42 PM Ordered:  Medications  oxyCODONE-acetaminophen (PERCOCET/ROXICET) 5-325 MG per tablet 2 tablet (not administered)   3:08 PM Recheck: Discussed lab results and treatment course with pt. Pt is feeling better. Pt is ready for discharge.      Labs Reviewed  URINALYSIS, ROUTINE W REFLEX MICROSCOPIC - Abnormal; Notable for the following:    Specific Gravity, Urine <1.005 (*)    All other components within normal limits   Dg Lumbar Spine Complete  03/10/2012  *RADIOLOGY REPORT*  Clinical Data: Back pain, injury, fall  LUMBAR SPINE - COMPLETE 4+ VIEW  Comparison: 03/06/2010  Findings: Transitional vertebral body at S1.  Degenerative changes noted throughout the lumbar spine, most pronounced at L4-5 and L5 S1.  Large anterior osteophytes at these lower levels.  Bilateral pars defects noted at L5, better appreciated on the comparison CT scan.  No associated anterior slippage.  Intact pedicles.  Normal SI joints.  Nonobstructive bowel gas pattern.  No acute compression fracture or wedge-shaped deformity.  No focal kyphosis.  IMPRESSION: Diffuse lumbar degenerative changes and spondylosis.  Bilateral L5 pars defects  No compression fracture.   Original Report Authenticated By: Judie Petit. Miles Costain, M.D.    US Scrotum  03/10/2012  *RADIOLOGY REPORT*  Clinical Data:  Bilateral testicular pain.  SCROTAL ULTRASOUND DOPPLER ULTRASOUND OF THE TESTICLES  Technique: Complete ultrasound examination of the testicles, epididymis, and other scrotal structures was performed.  Color  and spectral Doppler ultrasound were also utilized to evaluate blood flow to the testicles.  Comparison:  None.  Findings:  Right testis:  4.5 x 2.6 x 2.9 cm.  Normal echotexture.  No focal abnormality.  Normal arterial and venous blood flow documented. Scattered microcalcifications.  Left testis:  4.2 x 2.6 x 3.3 cm.  No focal abnormality.  Normal echotexture.  Normal arterial and venous blood flow.  Scattered microcalcifications.  Right epididymis:  5 mm cyst in the epididymal head.  Otherwise unremarkable.  Left epididymis:  Normal in size and appearance.  Hydrocele:  Small bilateral  hydroceles.  Varicocele:  Absent  Pulsed Doppler interrogation of both testes demonstrates low resistance flow bilaterally.  IMPRESSION: No evidence of testicular torsion or orchitis.  Small bilateral hydroceles.  Microlithiasis. Current literature suggests that testicular microlithiasis is not a significant independent risk factor for development of testicular carcinoma, and that followup imaging is not warranted in the absence of other risk factors.  Monthly testicular self-examination and annual physical exams are considered appropriate surveillance.  If patient has other risk factors for testicular carcinoma, then referral to Urology should be considered.  (Reference:  DeCastro, et al.:  A 5-Year Followup Study of Asymptomatic Men with Testicular Microlithiasis.  J Urol 2008; 179:1420-1423.)   Original Report Authenticated By: Charlett Nose, M.D.    [place] Art/ven Flow Abd Pelv Doppler Limited  03/10/2012  *RADIOLOGY REPORT*  Clinical Data:  Bilateral testicular pain.  SCROTAL ULTRASOUND DOPPLER ULTRASOUND OF THE TESTICLES  Technique: Complete ultrasound examination of the testicles, epididymis, and other scrotal structures was performed.  Color and spectral Doppler ultrasound were also utilized to evaluate blood flow to the testicles.  Comparison:  None.  Findings:  Right testis:  4.5 x 2.6 x 2.9 cm.  Normal echotexture.  No focal  abnormality.  Normal arterial and venous blood flow documented. Scattered microcalcifications.  Left testis:  4.2 x 2.6 x 3.3 cm.  No focal abnormality.  Normal echotexture.  Normal arterial and venous blood flow.  Scattered microcalcifications.  Right epididymis:  5 mm cyst in the epididymal head.  Otherwise unremarkable.  Left epididymis:  Normal in size and appearance.  Hydrocele:  Small bilateral hydroceles.  Varicocele:  Absent  Pulsed Doppler interrogation of both testes demonstrates low resistance flow bilaterally.  IMPRESSION: No evidence of testicular torsion or orchitis.  Small bilateral hydroceles.  Microlithiasis. Current literature suggests that testicular microlithiasis is not a significant independent risk factor for development of testicular carcinoma, and that followup imaging is not warranted in the absence of other risk factors.  Monthly testicular self-examination and annual physical exams are considered appropriate surveillance.  If patient has other risk factors for testicular carcinoma, then referral to Urology should be considered.  (Reference:  DeCastro, et al.:  A 5-Year Followup Study of Asymptomatic Men with Testicular Microlithiasis.  J Urol 2008; 179:1420-1423.)   Original Report Authenticated By: Charlett Nose, M.D.      No diagnosis found.    MDM  Low back pain after falling yesterday. Radiates down left leg. No weakness, numbness or tingling. No bowel or bladder incontinence. Complains of bilateral testicular pain that started after the fall as well. No abdominal pain  No red flags on neurological exam. Testicles are nontender bilaterally with no palpable masses. UA negative. X-ray negative for acute fracture.  [place] without evidence of torsion.  Pain improved with medications.  Ambulatory in the ED without neurological deficit.   I personally performed the services described in this documentation, which was scribed in my presence. The recorded information has been  reviewed and is accurate.      Glynn Octave, MD 03/10/12 [phone]

## 2012-03-10 NOTE — ED Notes (Signed)
Patient able to ambulate without assistance.  Patient stated that he had pain in his lower back radiating down his left leg while walking.

## 2012-03-10 NOTE — ED Notes (Signed)
Low back pain , slipped and fell.

## 2012-03-10 NOTE — ED Notes (Signed)
Pt transported to room 7. Pt given urinal for void. Hall pass completed.

## 2012-03-31 ENCOUNTER — Emergency Department (HOSPITAL_COMMUNITY): Payer: Medicaid Other

## 2012-03-31 ENCOUNTER — Encounter (HOSPITAL_COMMUNITY): Payer: Self-pay | Admitting: *Deleted

## 2012-03-31 ENCOUNTER — Emergency Department (HOSPITAL_COMMUNITY)
Admission: EM | Admit: 2012-03-31 | Discharge: 2012-03-31 | Disposition: A | Discharge status: Home / Self Care | Payer: Medicaid Other | Attending: Emergency Medicine | Admitting: Emergency Medicine

## 2012-03-31 DIAGNOSIS — R0602 Shortness of breath: Secondary | ICD-10-CM | POA: Insufficient documentation

## 2012-03-31 DIAGNOSIS — S60229A Contusion of unspecified hand, initial encounter: Secondary | ICD-10-CM | POA: Insufficient documentation

## 2012-03-31 DIAGNOSIS — K219 Gastro-esophageal reflux disease without esophagitis: Secondary | ICD-10-CM | POA: Insufficient documentation

## 2012-03-31 DIAGNOSIS — Z862 Personal history of diseases of the blood and blood-forming organs and certain disorders involving the immune mechanism: Secondary | ICD-10-CM | POA: Insufficient documentation

## 2012-03-31 DIAGNOSIS — Z8679 Personal history of other diseases of the circulatory system: Secondary | ICD-10-CM | POA: Insufficient documentation

## 2012-03-31 DIAGNOSIS — Z87891 Personal history of nicotine dependence: Secondary | ICD-10-CM | POA: Insufficient documentation

## 2012-03-31 DIAGNOSIS — Z860100 Personal history of colon polyps, unspecified: Secondary | ICD-10-CM | POA: Insufficient documentation

## 2012-03-31 DIAGNOSIS — IMO0002 Reserved for concepts with insufficient information to code with codable children: Secondary | ICD-10-CM | POA: Insufficient documentation

## 2012-03-31 DIAGNOSIS — R109 Unspecified abdominal pain: Secondary | ICD-10-CM | POA: Insufficient documentation

## 2012-03-31 DIAGNOSIS — F411 Generalized anxiety disorder: Secondary | ICD-10-CM | POA: Insufficient documentation

## 2012-03-31 DIAGNOSIS — Z8639 Personal history of other endocrine, nutritional and metabolic disease: Secondary | ICD-10-CM | POA: Insufficient documentation

## 2012-03-31 DIAGNOSIS — Z79899 Other long term (current) drug therapy: Secondary | ICD-10-CM | POA: Insufficient documentation

## 2012-03-31 DIAGNOSIS — Y9289 Other specified places as the place of occurrence of the external cause: Secondary | ICD-10-CM | POA: Insufficient documentation

## 2012-03-31 DIAGNOSIS — Y9389 Activity, other specified: Secondary | ICD-10-CM | POA: Insufficient documentation

## 2012-03-31 DIAGNOSIS — Z8711 Personal history of peptic ulcer disease: Secondary | ICD-10-CM | POA: Insufficient documentation

## 2012-03-31 MED ORDER — DICLOFENAC SODIUM 75 MG PO TBEC: 75.0000 mg | DELAYED_RELEASE_TABLET | Freq: Two times a day (BID) | ORAL | Status: AC

## 2012-03-31 MED ORDER — ACETAMINOPHEN-CODEINE #3 300-30 MG PO TABS: 1.0000 | ORAL_TABLET | Freq: Four times a day (QID) | ORAL | Status: DC | PRN

## 2012-03-31 NOTE — ED Notes (Signed)
Pt states he punched his dresser this morning. Swelling and bruising to left hand. NAD.

## 2012-03-31 NOTE — ED Notes (Signed)
Pain swelling lt hand , "punched a dresser " this am  Ice pack applied.

## 2012-03-31 NOTE — ED Provider Notes (Signed)
History     CSN: [id number]  Arrival date & time 03/31/12  1233   First MD Initiated Contact with Patient 03/31/12 1548      Chief Complaint  Patient presents with  . Hand Injury    (Consider location/radiation/quality/duration/timing/severity/associated sxs/prior treatment) Patient is a 42 y.o. male presenting with hand injury. The history is provided by the patient.  Hand Injury Location:  Hand Time since incident:  8 hours Injury: yes   Mechanism of injury comment:  Pt punched a dresser this AM. Hand location:  L hand Pain details:    Quality:  Aching and throbbing   Radiates to:  Does not radiate   Severity:  Moderate   Onset quality:  Sudden Chronicity:  New Handedness:  Right-handed Dislocation: no   Foreign body present:  No foreign bodies Prior injury to area:  Yes Relieved by:  Nothing Worsened by:  Nothing tried Ineffective treatments:  None tried Associated symptoms: stiffness and swelling   Associated symptoms: no back pain, no neck pain and no numbness     Past Medical History  Diagnosis Date  . Anxiety   . Anemia     GI bleeding - required transfusion  . GERD (gastroesophageal reflux disease)   . Peptic ulcer disease     EGD 2012  . History of colonic polyps     Colonoscopy 2012  . Supraventricular tachycardia   . Blood transfusion   . Hyperlipidemia     Past Surgical History  Procedure Laterality Date  . Steel rod left leg    . Appendectomy    . Hernia repair  2012  . Rotator cuff repair  2002    right    Family History  Problem Relation Age of Onset  . Cancer Mother     lung  . Cancer Maternal Uncle     lung  . Cancer Paternal Uncle     throat  . Cancer Cousin     colon  . Cancer Paternal Uncle     mouth    History  Substance Use Topics  . Smoking status: Former Smoker    Types: Cigarettes    Quit date: 06/18/2009  . Smokeless tobacco: Never Used  . Alcohol Use: No     Comment: Former regular use      Review of  Systems  Constitutional: Negative for activity change.       All ROS Neg except as noted in HPI  HENT: Negative for nosebleeds and neck pain.   Eyes: Negative for photophobia and discharge.  Respiratory: Positive for shortness of breath. Negative for cough and wheezing.   Cardiovascular: Negative for chest pain and palpitations.  Gastrointestinal: Positive for abdominal pain. Negative for blood in stool.  Genitourinary: Negative for dysuria, frequency and hematuria.  Musculoskeletal: Positive for stiffness. Negative for back pain and arthralgias.  Skin: Negative.   Neurological: Negative for dizziness, seizures and speech difficulty.  Psychiatric/Behavioral: Negative for hallucinations and confusion. The patient is nervous/anxious.     Allergies  Review of patient's allergies indicates no known allergies.  Home Medications   Current Outpatient Rx  Name  Route  Sig  Dispense  Refill  . ALPRAZolam (XANAX) 1 MG tablet   Oral   Take 1 mg by mouth 2 (two) times daily as needed for anxiety.          Marland Kitchen HYDROcodone-acetaminophen (VICODIN) 5-500 MG per tablet   Oral   Take 1-2 tablets by mouth every 6 (six) hours  as needed for pain.          Marland Kitchen olanzapine-FLUoxetine (SYMBYAX) 6-25 MG per capsule   Oral   Take 1 capsule by mouth every evening.         Marland Kitchen omeprazole (PRILOSEC) 20 MG capsule   Oral   Take 20 mg by mouth at bedtime.            BP 142/87  Pulse 109  Temp(Src) 97.2 F (36.2 C) (Oral)  SpO2 100%  Physical Exam  Nursing note and vitals reviewed. Constitutional: He is oriented to person, place, and time. He appears well-developed and well-nourished.  Non-toxic appearance.  HENT:  Head: Normocephalic.  Right Ear: Tympanic membrane and external ear normal.  Left Ear: Tympanic membrane and external ear normal.  Eyes: EOM and lids are normal. Pupils are equal, round, and reactive to light.  Neck: Normal range of motion. Neck supple. Carotid bruit is not present.   Cardiovascular: Normal rate, regular rhythm, normal heart sounds, intact distal pulses and normal pulses.   Pulmonary/Chest: Breath sounds normal. No respiratory distress.  Abdominal: Soft. Bowel sounds are normal. There is no tenderness. There is no guarding.  Musculoskeletal: Normal range of motion.  Swelling of the dorsum of the left hand. Pain and swelling of the 4th and 5th  MP joint area. Good ROm of all finger and wrist. Cap refill less than 3 sec. Sensory intact.  Lymphadenopathy:       Head (right side): No submandibular adenopathy present.       Head (left side): No submandibular adenopathy present.    He has no cervical adenopathy.  Neurological: He is alert and oriented to person, place, and time. He has normal strength. No cranial nerve deficit or sensory deficit.  Skin: Skin is warm and dry.  Psychiatric: He has a normal mood and affect. His speech is normal.    ED Course  Procedures (including critical care time)  Labs Reviewed - No data to display Dg Hand Complete Left  03/31/2012  *RADIOLOGY REPORT*  Clinical Data: Injury, pain.  LEFT HAND - COMPLETE 3+ VIEW  Comparison: None.  Findings: No acute bony or joint abnormality is identified.  Old healed fifth metacarpal fractures noted.  The patient is status post amputation of the index finger at the level of the base of the middle phalanx.  IMPRESSION:  1.  No acute finding. 2.  Old, healed fifth metacarpal fracture. 3.  Amputation of the index finger.   Original Report Authenticated By: Holley Dexter, M.D.      No diagnosis found.    MDM  I have reviewed nursing notes, vital signs, and all appropriate lab and imaging results for this patient.  Patient states he hit a dresser this morning and anger. He has had problems with swelling and pain of the left hand during the day since that time.  X-ray of the left hand is negative for acute findings.  Plan the patient is to apply ice to the hand today and tonight.  Diclofenac 2 times daily with food. Tylenol with codeine #3 every 6 hours as needed for pain. Patient is to see Dr. Hilda Lias for additional evaluation if not improving.       Kathie Dike, PA-C 04/04/12 2055

## 2012-04-06 NOTE — ED Provider Notes (Signed)
Medical screening examination/treatment/procedure(s) were performed by non-physician practitioner and as supervising physician I was immediately available for consultation/collaboration.  John M Bednar, MD 04/06/12 1619 

## 2013-12-11 ENCOUNTER — Encounter (INDEPENDENT_AMBULATORY_CARE_PROVIDER_SITE_OTHER): Payer: Self-pay | Admitting: *Deleted

## 2013-12-25 ENCOUNTER — Ambulatory Visit (INDEPENDENT_AMBULATORY_CARE_PROVIDER_SITE_OTHER): Payer: Medicaid Other | Admitting: Internal Medicine

## 2014-01-09 ENCOUNTER — Encounter (INDEPENDENT_AMBULATORY_CARE_PROVIDER_SITE_OTHER): Payer: Self-pay | Admitting: *Deleted

## 2014-01-09 ENCOUNTER — Telehealth (INDEPENDENT_AMBULATORY_CARE_PROVIDER_SITE_OTHER): Payer: Self-pay | Admitting: *Deleted

## 2014-01-09 NOTE — Telephone Encounter (Signed)
[name] NO SHOWED for his apt on 12/25/13 with Deberah Castle, NP. A NS letter has been mailed.

## 2014-06-29 ENCOUNTER — Emergency Department (HOSPITAL_COMMUNITY): Payer: Medicaid Other

## 2014-06-29 ENCOUNTER — Encounter (HOSPITAL_COMMUNITY): Payer: Self-pay | Admitting: Emergency Medicine

## 2014-06-29 ENCOUNTER — Emergency Department (HOSPITAL_COMMUNITY)
Admission: EM | Admit: 2014-06-29 | Discharge: 2014-06-29 | Disposition: A | Discharge status: Home / Self Care | Payer: Medicaid Other | Attending: Emergency Medicine | Admitting: Emergency Medicine

## 2014-06-29 DIAGNOSIS — Z79899 Other long term (current) drug therapy: Secondary | ICD-10-CM | POA: Diagnosis not present

## 2014-06-29 DIAGNOSIS — Z8711 Personal history of peptic ulcer disease: Secondary | ICD-10-CM | POA: Insufficient documentation

## 2014-06-29 DIAGNOSIS — Z87891 Personal history of nicotine dependence: Secondary | ICD-10-CM | POA: Insufficient documentation

## 2014-06-29 DIAGNOSIS — Z862 Personal history of diseases of the blood and blood-forming organs and certain disorders involving the immune mechanism: Secondary | ICD-10-CM | POA: Insufficient documentation

## 2014-06-29 DIAGNOSIS — Z86010 Personal history of colonic polyps: Secondary | ICD-10-CM | POA: Diagnosis not present

## 2014-06-29 DIAGNOSIS — K219 Gastro-esophageal reflux disease without esophagitis: Secondary | ICD-10-CM | POA: Insufficient documentation

## 2014-06-29 DIAGNOSIS — Y939 Activity, unspecified: Secondary | ICD-10-CM | POA: Insufficient documentation

## 2014-06-29 DIAGNOSIS — Z8639 Personal history of other endocrine, nutritional and metabolic disease: Secondary | ICD-10-CM | POA: Insufficient documentation

## 2014-06-29 DIAGNOSIS — M7032 Other bursitis of elbow, left elbow: Secondary | ICD-10-CM | POA: Diagnosis not present

## 2014-06-29 DIAGNOSIS — F419 Anxiety disorder, unspecified: Secondary | ICD-10-CM | POA: Insufficient documentation

## 2014-06-29 DIAGNOSIS — M719 Bursopathy, unspecified: Secondary | ICD-10-CM

## 2014-06-29 DIAGNOSIS — M7989 Other specified soft tissue disorders: Secondary | ICD-10-CM | POA: Diagnosis present

## 2014-06-29 DIAGNOSIS — Z8679 Personal history of other diseases of the circulatory system: Secondary | ICD-10-CM | POA: Diagnosis not present

## 2014-06-29 MED ORDER — SULFAMETHOXAZOLE-TRIMETHOPRIM 800-160 MG PO TABS: 1.0000 | ORAL_TABLET | Freq: Two times a day (BID) | ORAL | Status: AC

## 2014-06-29 MED ORDER — OXYCODONE-ACETAMINOPHEN 5-325 MG PO TABS
1.0000 | ORAL_TABLET | Freq: Once | ORAL | Status: AC
  Administered 2014-06-29: 1 via ORAL
  Filled 2014-06-29: qty 1

## 2014-06-29 MED ORDER — CELECOXIB 200 MG PO CAPS: 200.0000 mg | ORAL_CAPSULE | Freq: Two times a day (BID) | ORAL | Status: DC

## 2014-06-29 NOTE — ED Provider Notes (Signed)
CSN: [id number]     Arrival date & time 06/29/14  1427 History   First MD Initiated Contact with Patient 06/29/14 1602     Chief Complaint  Patient presents with  . Joint Swelling     (Consider location/radiation/quality/duration/timing/severity/associated sxs/prior Treatment) Patient is a 44 y.o. male presenting with arm injury. The history is provided by the patient (pt complains of left elbow pain).  Arm Injury Upper extremity pain location: left elbow. Pain details:    Quality:  Aching   Radiates to:  Does not radiate   Severity:  Moderate   Onset quality:  Gradual   Timing:  Constant   Progression:  Unchanged Associated symptoms: no back pain and no fatigue     Past Medical History  Diagnosis Date  . Anxiety   . Anemia     GI bleeding - required transfusion  . GERD (gastroesophageal reflux disease)   . Peptic ulcer disease     EGD 2012  . History of colonic polyps     Colonoscopy 2012  . Supraventricular tachycardia   . Blood transfusion   . Hyperlipidemia    Past Surgical History  Procedure Laterality Date  . Steel rod left leg    . Appendectomy    . Hernia repair  2012  . Rotator cuff repair  2002    right   Family History  Problem Relation Age of Onset  . Cancer Mother     lung  . Cancer Maternal Uncle     lung  . Cancer Paternal Uncle     throat  . Cancer Cousin     colon  . Cancer Paternal Uncle     mouth   History  Substance Use Topics  . Smoking status: Former Smoker -- 1.00 packs/day for 20 years    Types: Cigarettes    Quit date: 06/18/2009  . Smokeless tobacco: Never Used  . Alcohol Use: No     Comment: occasionally    Review of Systems  Constitutional: Negative for appetite change and fatigue.  HENT: Negative for congestion, ear discharge and sinus pressure.   Eyes: Negative for discharge.  Respiratory: Negative for cough.   Cardiovascular: Negative for chest pain.  Gastrointestinal: Negative for abdominal pain and diarrhea.   Genitourinary: Negative for frequency and hematuria.  Musculoskeletal: Negative for back pain.       Left arm pain and elbow swelling  Skin: Negative for rash.  Neurological: Negative for seizures and headaches.  Psychiatric/Behavioral: Negative for hallucinations.      Allergies  Review of patient's allergies indicates no known allergies.  Home Medications   Prior to Admission medications   Medication Sig Start Date End Date Taking? Authorizing Provider  ALPRAZolam Duanne Moron) 1 MG tablet Take 1 mg by mouth 2 (two) times daily as needed for anxiety.  05/25/11  Yes Historical Provider, MD  loratadine (CLARITIN) 10 MG tablet Take 10 mg by mouth daily.   Yes Historical Provider, MD  omeprazole (PRILOSEC) 20 MG capsule Take 20 mg by mouth at bedtime.    Yes Historical Provider, MD  acetaminophen-codeine (TYLENOL #3) 300-30 MG per tablet Take 1-2 tablets by mouth every 6 (six) hours as needed for pain. Patient not taking: Reported on 06/29/2014 03/31/12   Lily Kocher, PA-C  celecoxib (CELEBREX) 200 MG capsule Take 1 capsule (200 mg total) by mouth 2 (two) times daily. 06/29/14   Milton Ferguson, MD  sulfamethoxazole-trimethoprim (BACTRIM DS,SEPTRA DS) 800-160 MG per tablet Take 1 tablet by mouth  2 (two) times daily. 06/29/14 07/06/14  Milton Ferguson, MD   BP 118/65 mmHg  Pulse 97  Temp(Src) 98.1 F (36.7 C) (Oral)  Resp 18  Ht 6\' 5"  (1.956 m)  Wt 286 lb (129.729 kg)  BMI 33.91 kg/m2  SpO2 98% Physical Exam  Constitutional: He is oriented to person, place, and time. He appears well-developed.  HENT:  Head: Normocephalic.  Eyes: Conjunctivae are normal.  Neck: No tracheal deviation present.  Cardiovascular:  No murmur heard. Musculoskeletal:  Swelling and tender left elbow.  bursitis  Neurological: He is oriented to person, place, and time.  Skin: Skin is warm.  Psychiatric: He has a normal mood and affect.    ED Course  Procedures (including critical care time) Labs Review Labs  Reviewed - No data to display  Imaging Review Dg Elbow Complete Left  06/29/2014   CLINICAL DATA:  44 year old male with area of swelling in the region of the olecranon process.  EXAM: LEFT ELBOW - COMPLETE 3+ VIEW  COMPARISON:  No priors.  FINDINGS: Four views of the left elbow demonstrate extensive soft tissue swelling overlying the olecranon process, suggesting bursitis. An enthesophyte is present on the olecranon. No acute displaced fracture, subluxation or dislocation. Small osteochondral extending off the anterior surface of the distal third of the humeral diaphysis incidentally noted.  IMPRESSION: 1. Soft tissue swelling overlying wall olecranon suggestive of bursitis. 2. No underlying displaced fracture, subluxation or dislocation. 3. Additional incidental findings, as above.   Electronically Signed   By: Vinnie Langton M.D.   On: 06/29/2014 15:28     EKG Interpretation None      MDM   Final diagnoses:  Bursitis    Bursitis,  tx with celebrex, bactrim and follow up with ortho    Milton Ferguson, MD 06/29/14 1641

## 2014-06-29 NOTE — ED Notes (Signed)
Patient c/o pain to left elbow with swelling and redness. Elbow hot to touch. Patient denies any injury or fevers. Denies hx of gout.

## 2014-06-29 NOTE — Discharge Instructions (Signed)
Follow up with dr. Harrison next week. °

## 2015-10-13 ENCOUNTER — Other Ambulatory Visit: Payer: Self-pay | Admitting: Physician Assistant

## 2016-05-10 ENCOUNTER — Other Ambulatory Visit: Payer: Self-pay | Admitting: Physician Assistant

## 2016-05-20 IMAGING — DX DG ELBOW COMPLETE 3+V*L*
4 series · 4 of 4 positions shown · non-contrast
Comparison: No priors.

CLINICAL DATA: 44-year-old male with area of swelling in the region
of the olecranon process.

EXAM:
LEFT ELBOW - COMPLETE 3+ VIEW

[elbow ap]
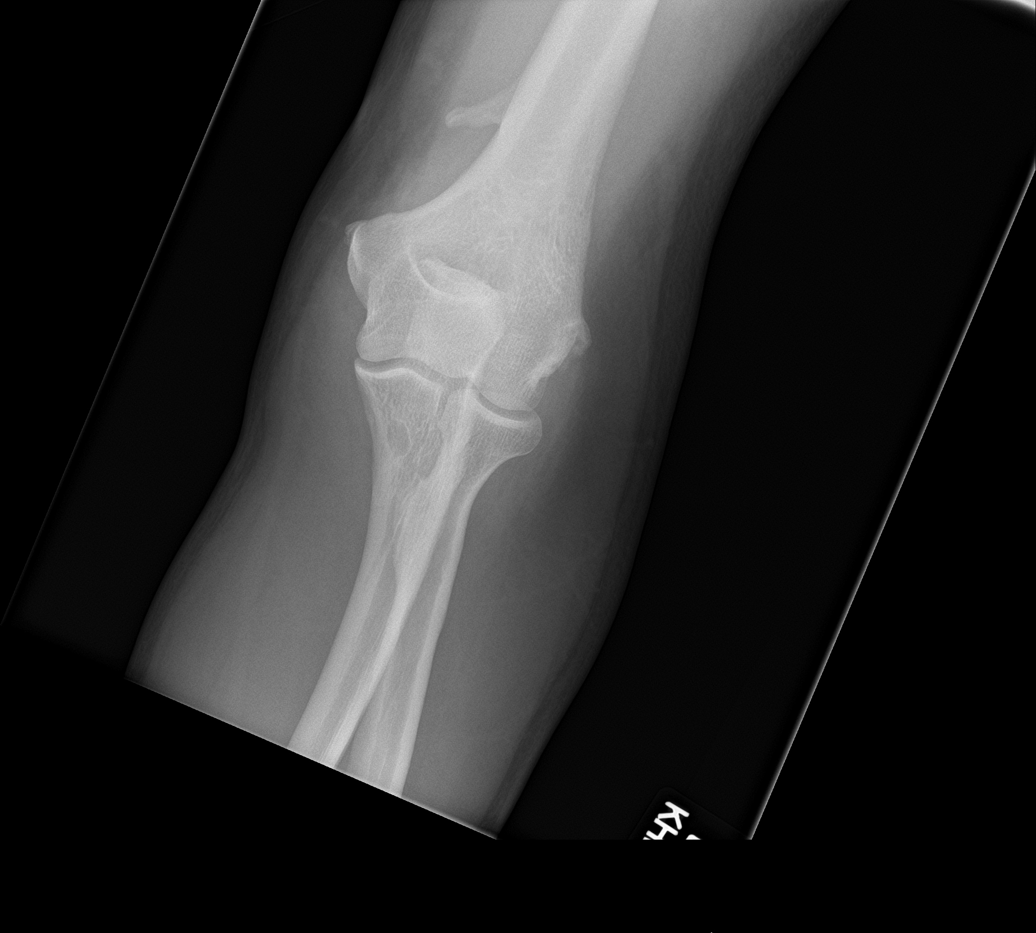

[elbow obl (1 of 2)]
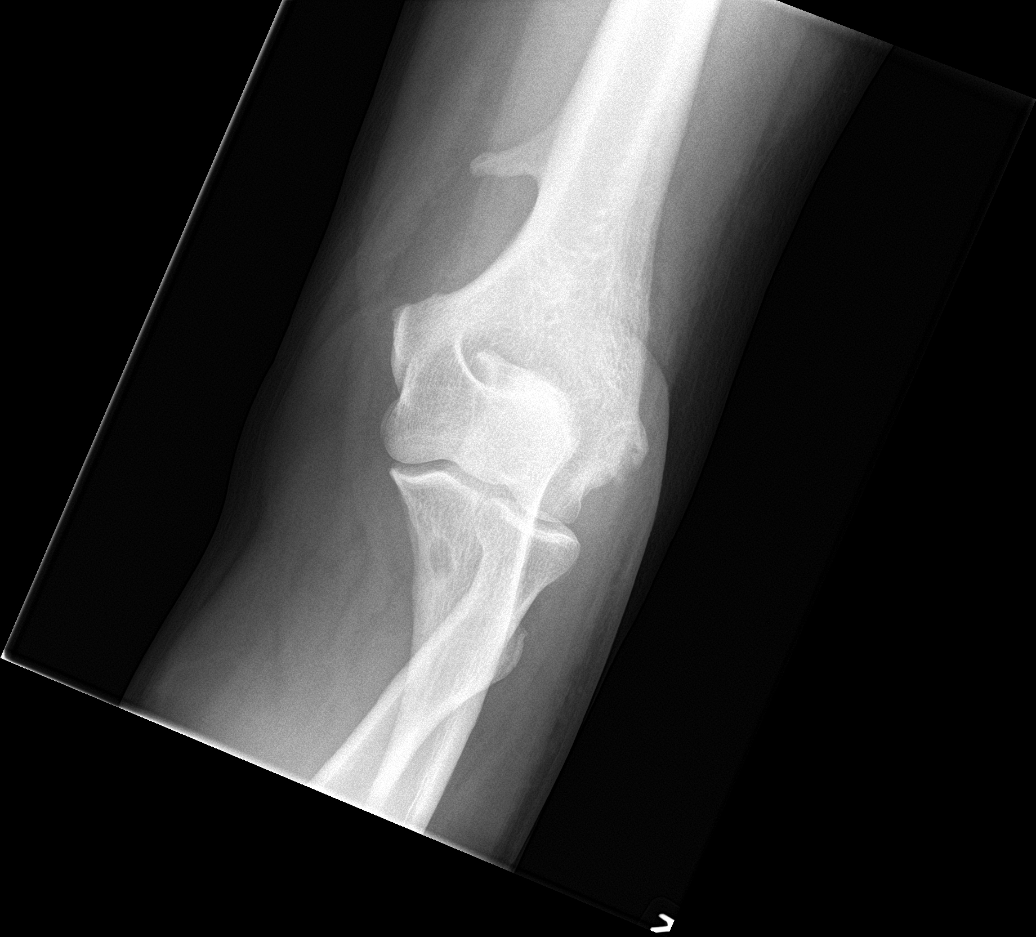

[elbow obl (2 of 2)]
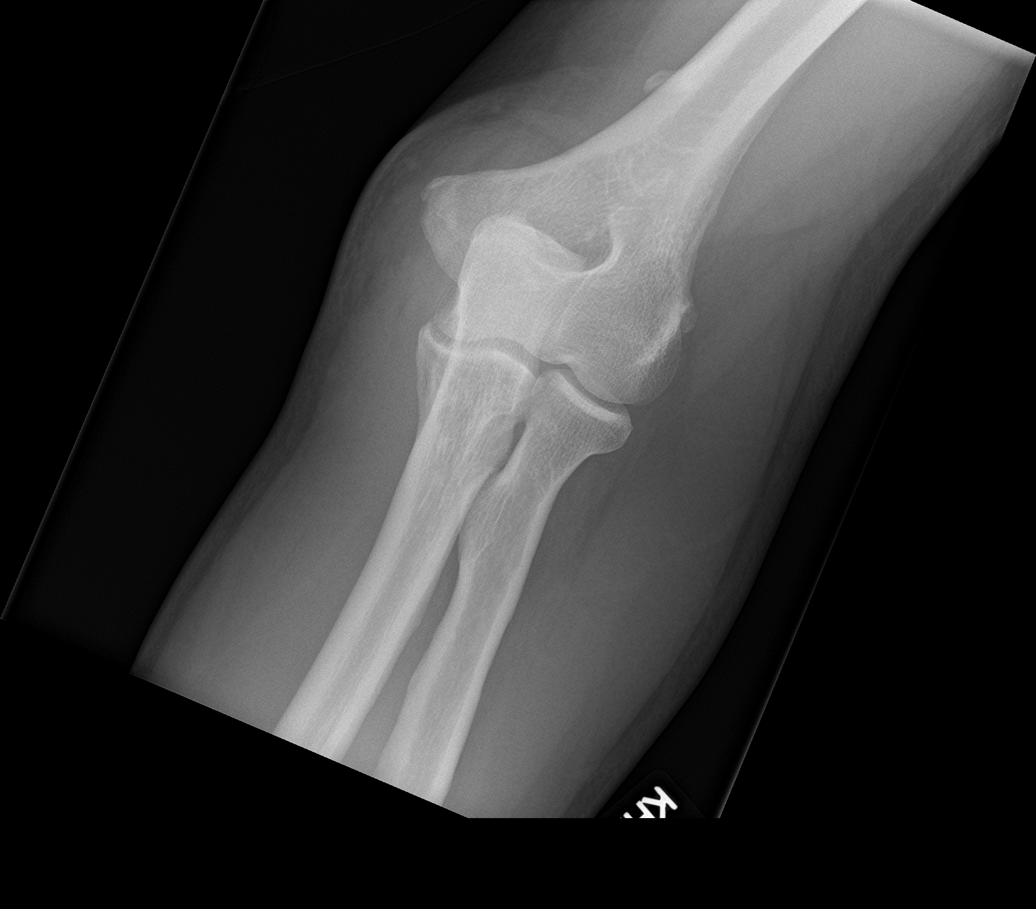

[elbow lat]
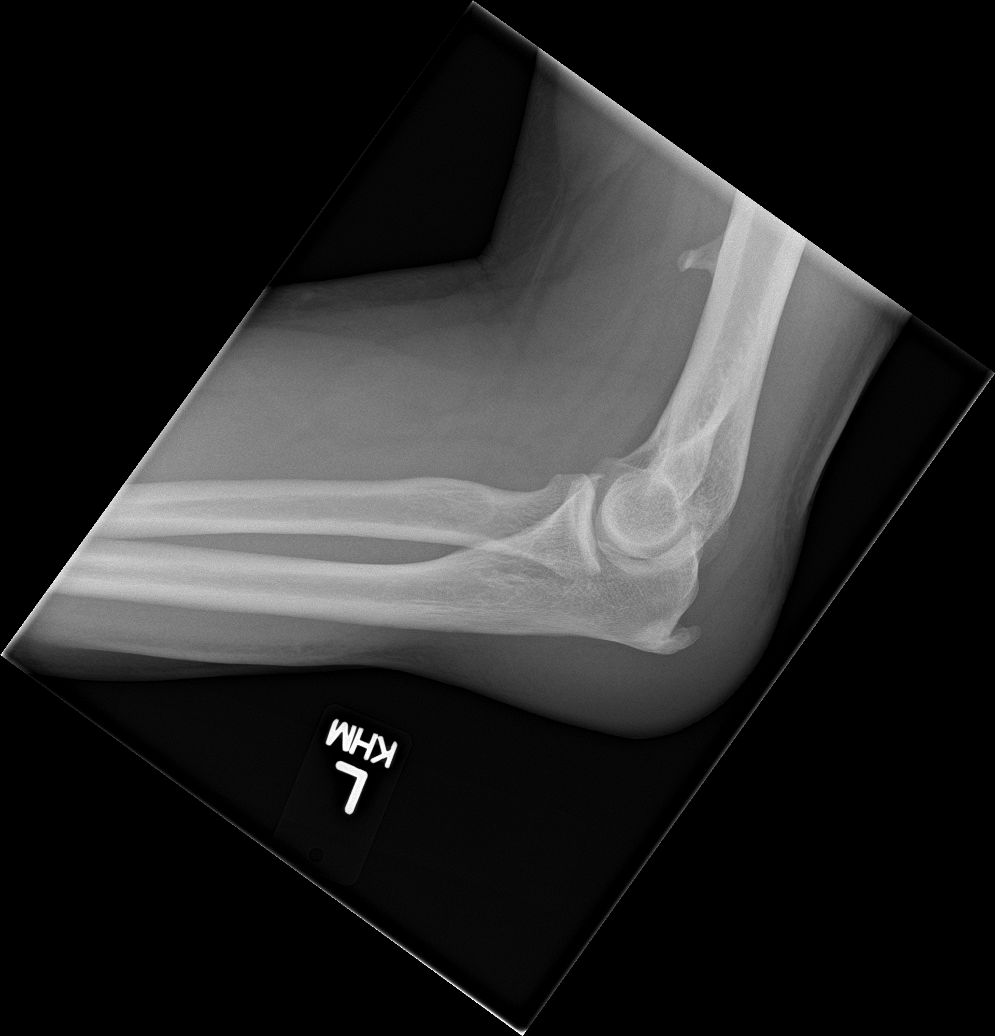

[4 of 4 positions shown; findings below may reference images not displayed]

FINDINGS: Four views of the left elbow demonstrate extensive soft tissue
swelling overlying the olecranon process, suggesting bursitis. An
enthesophyte is present on the olecranon. No acute displaced
fracture, subluxation or dislocation. Small osteochondral extending
off the anterior surface of the distal third of the humeral
diaphysis incidentally noted.
IMPRESSION: 1. Soft tissue swelling overlying wall olecranon suggestive of
bursitis.
2. No underlying displaced fracture, subluxation or dislocation.
3. Additional incidental findings, as above.

## 2016-08-31 ENCOUNTER — Other Ambulatory Visit: Payer: Self-pay | Admitting: Physician Assistant

## 2016-11-13 ENCOUNTER — Encounter (HOSPITAL_COMMUNITY): Payer: Self-pay | Admitting: Emergency Medicine

## 2016-11-13 ENCOUNTER — Emergency Department (HOSPITAL_COMMUNITY)
Admission: EM | Admit: 2016-11-13 | Discharge: 2016-11-13 | Disposition: A | Discharge status: Home / Self Care | Payer: Self-pay | Attending: Emergency Medicine | Admitting: Emergency Medicine

## 2016-11-13 DIAGNOSIS — R590 Localized enlarged lymph nodes: Secondary | ICD-10-CM | POA: Insufficient documentation

## 2016-11-13 DIAGNOSIS — H6501 Acute serous otitis media, right ear: Secondary | ICD-10-CM | POA: Insufficient documentation

## 2016-11-13 DIAGNOSIS — Z79899 Other long term (current) drug therapy: Secondary | ICD-10-CM | POA: Insufficient documentation

## 2016-11-13 DIAGNOSIS — R509 Fever, unspecified: Secondary | ICD-10-CM | POA: Insufficient documentation

## 2016-11-13 DIAGNOSIS — Z87891 Personal history of nicotine dependence: Secondary | ICD-10-CM | POA: Insufficient documentation

## 2016-11-13 DIAGNOSIS — I10 Essential (primary) hypertension: Secondary | ICD-10-CM | POA: Insufficient documentation

## 2016-11-13 MED ORDER — AMOXICILLIN 875 MG PO TABS: 875.0000 mg | ORAL_TABLET | Freq: Two times a day (BID) | ORAL | 0 refills | Status: AC

## 2016-11-13 NOTE — Discharge Instructions (Signed)
As discussed, take your entire course of antibiotics even if you feel better. Stay well-hydrated and take Tylenol for fever and pain. Follow-up with your primary care provider.

## 2016-11-13 NOTE — ED Triage Notes (Signed)
R earache that is severe and he reports that he is a bit dizzy also

## 2016-11-13 NOTE — ED Provider Notes (Signed)
[facility] EMERGENCY DEPARTMENT Provider Note   CSN: [id number] Arrival date & time: 11/13/16  1659     History   Chief Complaint Chief Complaint  Patient presents with  . Otalgia    right    HPI [name] is a 46 y.o. male presenting with 2 weeks of right ear pain which has gotten much worse over the last 2-3 nights. He has noted some drainage today. Reports a fever of 102 yesterday and some chills. He has tried Tylenol with modest relief. He reports a sensation of pressure and fullness, no tinnitus no loss of hearing. He denies any known trauma but has been using cotton swabs daily. No other complaints.  HPI  Past Medical History:  Diagnosis Date  . Anemia    GI bleeding - required transfusion  . Anxiety   . Blood transfusion   . GERD (gastroesophageal reflux disease)   . History of colonic polyps    Colonoscopy 2012  . Hyperlipidemia   . Peptic ulcer disease    EGD 2012  . Supraventricular tachycardia [facility])     Patient Active Problem List   Diagnosis Date Noted  . Internal and external bleeding hemorrhoids 08/03/2011  . Chronic GI bleeding 04/30/2010  . Abnormal cardiovascular function study 04/30/2010  . Essential hypertension, benign 03/23/2010  . PALPITATIONS 03/23/2010  . ALCOHOL ABUSE, HX OF 03/23/2010    Past Surgical History:  Procedure Laterality Date  . APPENDECTOMY    . HERNIA REPAIR  2012  . ROTATOR CUFF REPAIR  2002   right  . Steel rod left leg         Home Medications    Prior to Admission medications   Medication Sig Start Date End Date Taking? Authorizing Provider  acetaminophen-codeine (TYLENOL #3) 300-30 MG per tablet Take 1-2 tablets by mouth every 6 (six) hours as needed for pain. Patient not taking: Reported on 06/29/2014 03/31/12   Lily Kocher, PA-C  ALPRAZolam Duanne Moron) 1 MG tablet Take 1 mg by mouth 2 (two) times daily as needed for anxiety.  05/25/11   [provider]  amoxicillin (AMOXIL) 875 MG tablet Take 1  tablet (875 mg total) by mouth 2 (two) times daily. 11/13/16 11/20/16  Avie Echevaria B, PA-C  celecoxib (CELEBREX) 200 MG capsule Take 1 capsule (200 mg total) by mouth 2 (two) times daily. 06/29/14   Milton Ferguson, MD  loratadine (CLARITIN) 10 MG tablet Take 10 mg by mouth daily.    [provider]  omeprazole (PRILOSEC) 20 MG capsule Take 20 mg by mouth at bedtime.     [provider]    Family History Family History  Problem Relation Age of Onset  . Cancer Mother        lung  . Cancer Maternal Uncle        lung  . Cancer Paternal Uncle        throat  . Cancer Cousin        colon  . Cancer Paternal Uncle        mouth    Social History Social History  Substance Use Topics  . Smoking status: Former Smoker    Packs/day: 1.00    Years: 20.00    Types: Cigarettes    Quit date: 06/18/2009  . Smokeless tobacco: Never Used  . Alcohol use No     Comment: occasionally     Allergies   Patient has no known allergies.   Review of Systems Review of Systems  Constitutional: Positive for chills and fever.  HENT: Positive for ear discharge and ear pain. Negative for congestion, facial swelling, hearing loss, sinus pain, sinus pressure, sore throat and trouble swallowing.   Respiratory: Negative for cough, shortness of breath and wheezing.   Cardiovascular: Negative for chest pain.  Musculoskeletal: Negative for neck pain and neck stiffness.  Skin: Negative for color change, pallor and rash.  Neurological: Negative for dizziness, light-headedness and headaches.     Physical Exam Updated Vital Signs BP 130/88 (BP Location: Right Arm)   Pulse (!) 101   Temp 98.6 F (37 C) (Oral)   Resp 18   Ht 6\' 5"  (1.956 m)   Wt 99.8 kg (220 lb)   SpO2 100%   BMI 26.09 kg/m   Physical Exam  Constitutional: He appears well-developed and well-nourished. No distress.  Febrile, nontoxic-appearing, sitting comfortable in bed in no acute distress.  HENT:  Head:  Normocephalic and atraumatic.  Right Ear: External ear normal.  Left Ear: External ear normal.  Mouth/Throat: Oropharynx is clear and moist.  Left normal tympanic membrane. Right with significant effusion and erythema  Eyes: Conjunctivae and EOM are normal. Right eye exhibits no discharge. Left eye exhibits no discharge.  Neck: Normal range of motion. Neck supple.  Cardiovascular: Normal rate, regular rhythm and normal heart sounds.   No murmur heard. Pulmonary/Chest: Effort normal and breath sounds normal. No respiratory distress.  Musculoskeletal: He exhibits no edema.  Lymphadenopathy:    He has cervical adenopathy.  Neurological: He is alert.  Skin: Skin is warm and dry. No rash noted. He is not diaphoretic. No erythema. No pallor.  Psychiatric: He has a normal mood and affect.  Nursing note and vitals reviewed.    ED Treatments / Results  Labs (all labs ordered are listed, but only abnormal results are displayed) Labs Reviewed - No data to display  EKG  EKG Interpretation None       Radiology No results found.  Procedures Procedures (including critical care time)  Medications Ordered in ED Medications - No data to display   Initial Impression / Assessment and Plan / ED Course  I have reviewed the triage vital signs and the nursing notes.  Pertinent labs & imaging results that were available during my care of the patient were reviewed by me and considered in my medical decision making (see chart for details).     Patient presenting with right ear pain and effusion on exam.  He reports a fever at home high of 102.  Will treat for otitis media with amoxicillin and tylenol for pain and fever.  Discharge home with close follow-up with PCP. Discussed strict return precautions and advised to return to the emergency department if experiencing any new or worsening symptoms. Instructions were understood and patient agreed with discharge plan.  Final Clinical  Impressions(s) / ED Diagnoses   Final diagnoses:  Right acute serous otitis media, recurrence not specified    New Prescriptions Discharge Medication List as of 11/13/2016  6:07 PM    START taking these medications   Details  amoxicillin (AMOXIL) 875 MG tablet Take 1 tablet (875 mg total) by mouth 2 (two) times daily., Starting Sat 11/13/2016, Until Sat 11/20/2016, Print         Emeline General, PA-C 11/13/16 1856    Julianne Rice, MD 11/14/16 [phone]

## 2016-11-13 NOTE — ED Triage Notes (Signed)
Pt states that he has a bad ear ache on the right side and has been having drainage.  Pt states that he had a fever yesterday of 102.  Pt denies n/v/d.

## 2018-03-20 ENCOUNTER — Ambulatory Visit: Payer: Self-pay | Admitting: Physician Assistant

## 2018-04-07 ENCOUNTER — Ambulatory Visit: Payer: Self-pay | Admitting: Physician Assistant

## 2018-04-08 ENCOUNTER — Encounter: Payer: Self-pay | Admitting: Physician Assistant

## 2018-12-21 ENCOUNTER — Other Ambulatory Visit: Payer: Self-pay

## 2018-12-21 DIAGNOSIS — Z20822 Contact with and (suspected) exposure to covid-19: Secondary | ICD-10-CM

## 2018-12-22 LAB — NOVEL CORONAVIRUS, NAA: SARS-CoV-2, NAA: NOT DETECTED

## 2019-03-21 ENCOUNTER — Ambulatory Visit: Payer: Self-pay | Admitting: Physician Assistant

## 2019-03-26 ENCOUNTER — Ambulatory Visit: Payer: Self-pay | Admitting: Physician Assistant

## 2019-04-04 ENCOUNTER — Ambulatory Visit: Payer: Self-pay | Admitting: Physician Assistant

## 2019-04-10 ENCOUNTER — Ambulatory Visit: Payer: Self-pay | Admitting: Physician Assistant

## 2019-08-28 ENCOUNTER — Other Ambulatory Visit: Payer: Self-pay

## 2019-08-28 ENCOUNTER — Ambulatory Visit: Payer: Self-pay | Admitting: Physician Assistant

## 2019-08-28 ENCOUNTER — Encounter: Payer: Self-pay | Admitting: Physician Assistant

## 2019-08-28 VITALS — BP 134/80 | HR 90 | Temp 98.1°F | Ht 74.25 in | Wt 263.0 lb

## 2019-08-28 DIAGNOSIS — Z86010 Personal history of colonic polyps: Secondary | ICD-10-CM

## 2019-08-28 DIAGNOSIS — Z125 Encounter for screening for malignant neoplasm of prostate: Secondary | ICD-10-CM

## 2019-08-28 DIAGNOSIS — E669 Obesity, unspecified: Secondary | ICD-10-CM

## 2019-08-28 DIAGNOSIS — Z8639 Personal history of other endocrine, nutritional and metabolic disease: Secondary | ICD-10-CM

## 2019-08-28 DIAGNOSIS — K469 Unspecified abdominal hernia without obstruction or gangrene: Secondary | ICD-10-CM

## 2019-08-28 DIAGNOSIS — Z862 Personal history of diseases of the blood and blood-forming organs and certain disorders involving the immune mechanism: Secondary | ICD-10-CM

## 2019-08-28 DIAGNOSIS — Z7689 Persons encountering health services in other specified circumstances: Secondary | ICD-10-CM

## 2019-08-28 DIAGNOSIS — Z131 Encounter for screening for diabetes mellitus: Secondary | ICD-10-CM

## 2019-08-28 DIAGNOSIS — R200 Anesthesia of skin: Secondary | ICD-10-CM

## 2019-08-28 NOTE — Progress Notes (Signed)
BP 134/80   Pulse 90   Temp 98.1 F (36.7 C)   Ht 6' 2.25" (1.886 m)   Wt 263 lb (119.3 kg)   SpO2 98%   BMI 33.54 kg/m    Subjective:    Patient ID: [name], male    DOB: 1970-11-19, 49 y.o.   MRN: [id number]  HPI: [name] is a 49 y.o. male presenting on 08/28/2019 for New Patient (Initial Visit) (last PCP was Octavio Graves about 5 years ago in [place], [place].) and Mass (knot on stomach for about a year ago. sometimes irritating. pt thinks the knot moves around and was caused from him sneezing)   HPI  Pt had a negative covid 19 screening questionnaire.    Pt is a 79yoM who presents to [place] care.  Pt is mostly concerned about his hernia.    Pt seen at [facility] in [place] in 2020 for recurrent R inguinal hernia and abdominal hernia  (Dr Adrian Prows).  He says the abdominal hernia is bothering him now although he says his inguinal hernia repair is "messed up again".     Pt does not work currently.  Previously he worked as a Administrator.  He has not gotten the covid vaccination.   He had colonoscopy - several of them - had polyps-   Dr Laural Golden- most recently in 2012.    It appears that pt had many appointments scheduled with GI since that time but was a no-show.   Pt also complains of some numbness of both feet, distal half.  Pt does not report any history of heavy etoh but history of etoh is listed on his problem list.   This could potentially be contributing to his symptoms.     Relevant past medical, surgical, family and social history reviewed and updated as indicated. Interim medical history since our last visit reviewed. Allergies and medications reviewed and updated.  No current outpatient medications on file.    Review of Systems  Per HPI unless specifically indicated above     Objective:    BP 134/80   Pulse 90   Temp 98.1 F (36.7 C)   Ht 6' 2.25" (1.886 m)   Wt 263 lb (119.3 kg)   SpO2 98%   BMI 33.54 kg/m   Wt Readings from Last 3  Encounters:  08/28/19 263 lb (119.3 kg)  11/13/16 220 lb (99.8 kg)  06/29/14 286 lb (129.7 kg)    Physical Exam Vitals reviewed.  Constitutional:      General: He is not in acute distress.    Appearance: He is well-developed. He is not ill-appearing.  HENT:     Head: Normocephalic and atraumatic.     Mouth/Throat:     Pharynx: No oropharyngeal exudate.  Eyes:     Conjunctiva/sclera: Conjunctivae normal.     Pupils: Pupils are equal, round, and reactive to light.  Neck:     Thyroid: No thyromegaly.  Cardiovascular:     Rate and Rhythm: Normal rate and regular rhythm.     Pulses:          Dorsalis pedis pulses are 2+ on the right side and 2+ on the left side.       Posterior tibial pulses are 2+ on the right side and 2+ on the left side.  Pulmonary:     Effort: Pulmonary effort is normal.     Breath sounds: Normal breath sounds. No wheezing or rales.  Abdominal:  General: Bowel sounds are normal.     Palpations: Abdomen is soft. There is no mass.     Tenderness: There is no abdominal tenderness.     Hernia: A hernia is present. Hernia is present in the ventral area.     Comments: Large nontender abdominal hernia.  Unable to palpate inguinal hernia.    Musculoskeletal:     Cervical back: Neck supple.     Right lower leg: No edema.     Left lower leg: No edema.     Right foot: Normal range of motion. Bunion present. No foot drop.     Left foot: Normal range of motion. No foot drop.     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 6 sites sensed.     Skin integrity: Skin integrity normal.     Left foot:     Protective Sensation: 6 sites tested. 6 sites sensed.     Skin integrity: Skin integrity normal.  Lymphadenopathy:     Cervical: No cervical adenopathy.  Skin:    General: Skin is warm and dry.     Findings: No rash.  Neurological:     Mental Status: He is alert and oriented to person,  place, and time.  Psychiatric:        Behavior: Behavior normal.          Assessment & Plan:   Encounter Diagnoses  Name Primary?  . Encounter to establish care Yes  . Abdominal hernia without obstruction and without gangrene, recurrence not specified, unspecified hernia type   . History of colon polyps   . Obesity, unspecified classification, unspecified obesity type, unspecified whether serious comorbidity present   . History of anemia   . History of hyperlipidemia   . Screening for diabetes mellitus   . Numbness   . Screening for prostate cancer       -Refer back to dr Laural Golden for polyps -Refer to surgeon for hernia -get baseline Labs (also for neuropathy) -pt put on Dentist list per his request -pt is given CAFA / application for [employer] financial assistance -pt encouraged to get covid vaccination -pt to follow up 1 month.  He is to contact office sooner prn

## 2019-08-29 ENCOUNTER — Encounter (INDEPENDENT_AMBULATORY_CARE_PROVIDER_SITE_OTHER): Payer: Self-pay | Admitting: *Deleted

## 2019-09-13 ENCOUNTER — Ambulatory Visit: Payer: Self-pay | Admitting: General Surgery

## 2019-10-02 ENCOUNTER — Ambulatory Visit: Payer: Self-pay | Admitting: Physician Assistant
